# Patient Record
Sex: Female | Born: 1980
Health system: Southern US, Community
[De-identification: ages and names within clinical notes are randomized; demographics above are authoritative.]

## PROBLEM LIST (undated history)

## (undated) DIAGNOSIS — N23 Unspecified renal colic: Secondary | ICD-10-CM

## (undated) DIAGNOSIS — N200 Calculus of kidney: Secondary | ICD-10-CM

## (undated) DIAGNOSIS — E559 Vitamin D deficiency, unspecified: Secondary | ICD-10-CM

## (undated) DIAGNOSIS — G932 Benign intracranial hypertension: Secondary | ICD-10-CM

## (undated) DIAGNOSIS — K5090 Crohn's disease, unspecified, without complications: Secondary | ICD-10-CM

## (undated) DIAGNOSIS — F419 Anxiety disorder, unspecified: Secondary | ICD-10-CM

## (undated) DIAGNOSIS — G43909 Migraine, unspecified, not intractable, without status migrainosus: Secondary | ICD-10-CM

## (undated) DIAGNOSIS — D729 Disorder of white blood cells, unspecified: Secondary | ICD-10-CM

## (undated) DIAGNOSIS — D72828 Other elevated white blood cell count: Secondary | ICD-10-CM

## (undated) DIAGNOSIS — K589 Irritable bowel syndrome without diarrhea: Secondary | ICD-10-CM

## (undated) HISTORY — PX: TONSILLECTOMY: SUR1361

## (undated) HISTORY — DX: Irritable bowel syndrome, unspecified: K58.9

## (undated) HISTORY — DX: Unspecified renal colic: N23

## (undated) HISTORY — PX: LEEP: SHX91

## (undated) HISTORY — DX: Other elevated white blood cell count: D72.828

## (undated) HISTORY — PX: WISDOM TOOTH EXTRACTION: SHX21

## (undated) HISTORY — DX: Migraine, unspecified, not intractable, without status migrainosus: G43.909

## (undated) HISTORY — DX: Disorder of white blood cells, unspecified: D72.9

## (undated) HISTORY — DX: Calculus of kidney: N20.0

## (undated) HISTORY — PX: FOOT SURGERY: SHX648

## (undated) HISTORY — DX: Anxiety disorder, unspecified: F41.9

## (undated) HISTORY — DX: Vitamin D deficiency, unspecified: E55.9

## (undated) HISTORY — DX: Benign intracranial hypertension: G93.2

## (undated) HISTORY — DX: Crohn's disease, unspecified, without complications: K50.90

---
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
## 2003-03-08 ENCOUNTER — Ambulatory Visit (HOSPITAL_COMMUNITY): Admission: RE | Admit: 2003-03-08 | Discharge: 2003-03-08 | Payer: Self-pay | Admitting: Internal Medicine

## 2003-03-15 ENCOUNTER — Other Ambulatory Visit: Admission: RE | Admit: 2003-03-15 | Discharge: 2003-03-15 | Payer: Self-pay | Admitting: Obstetrics and Gynecology

## 2003-05-12 ENCOUNTER — Encounter (INDEPENDENT_AMBULATORY_CARE_PROVIDER_SITE_OTHER): Payer: Self-pay | Admitting: Specialist

## 2003-05-12 ENCOUNTER — Ambulatory Visit (HOSPITAL_COMMUNITY): Admission: RE | Admit: 2003-05-12 | Discharge: 2003-05-12 | Payer: Self-pay | Admitting: Obstetrics and Gynecology

## 2003-09-12 ENCOUNTER — Other Ambulatory Visit: Admission: RE | Admit: 2003-09-12 | Discharge: 2003-09-12 | Payer: Self-pay | Admitting: Obstetrics and Gynecology

## 2004-02-26 ENCOUNTER — Other Ambulatory Visit: Admission: RE | Admit: 2004-02-26 | Discharge: 2004-02-26 | Payer: Self-pay | Admitting: Obstetrics and Gynecology

## 2004-03-04 ENCOUNTER — Ambulatory Visit: Payer: Self-pay | Admitting: Internal Medicine

## 2004-07-08 ENCOUNTER — Ambulatory Visit: Payer: Self-pay | Admitting: Internal Medicine

## 2004-07-09 ENCOUNTER — Ambulatory Visit: Payer: Self-pay | Admitting: Internal Medicine

## 2004-07-30 ENCOUNTER — Ambulatory Visit: Payer: Self-pay | Admitting: Internal Medicine

## 2004-08-02 ENCOUNTER — Ambulatory Visit: Payer: Self-pay | Admitting: Internal Medicine

## 2004-08-07 ENCOUNTER — Encounter (INDEPENDENT_AMBULATORY_CARE_PROVIDER_SITE_OTHER): Payer: Self-pay | Admitting: *Deleted

## 2004-08-07 ENCOUNTER — Ambulatory Visit: Payer: Self-pay | Admitting: Internal Medicine

## 2004-08-30 ENCOUNTER — Other Ambulatory Visit: Admission: RE | Admit: 2004-08-30 | Discharge: 2004-08-30 | Payer: Self-pay | Admitting: Obstetrics and Gynecology

## 2004-09-02 ENCOUNTER — Other Ambulatory Visit: Admission: RE | Admit: 2004-09-02 | Discharge: 2004-09-02 | Payer: Self-pay | Admitting: Obstetrics and Gynecology

## 2005-01-17 IMAGING — CT CT ABDOMEN W/ CM
1 of 4 series · 14 of 32 positions shown, 19 images · IV contrast (omnipaque)
Comparison: none

CLINICAL DATA: Crohn's disease, abdominal pain for two months.  
CT ABDOMEN WITH CONTRAST 
Spiral scanning is performed after oral administration of dilute contrast and during intravenous administration of 150 cc of Omnipaque 300.  
The lung bases are clear.  The liver, spleen, pancreas, adrenal glands, and kidneys all appear normal.  No calcified gallstones.  No free fluid or air.  No bowel pathology is seen in the abdominal portion of the scan.
IMPRESSION
Normal CT scan of the abdomen.
CT PELVIS WITH CONTRAST 
Spiral scanning is performed after oral and intravenous contrast administration.  
There is no free fluid in the pelvis.  The terminal ileum appears normal without gross wall thickening.  The uterus and the left adnexal region appear normal.  Associated with the right ovary, there is an indistinct area of low density measuring 3 cm in diameter.  This could represent an ovarian cyst, an ovarian abscess, or hydrosalpinx.  No bowel pathology is seen in the pelvis. 
1.  No evidence of bowel pathology.  No CT evidence of Crohn's disease. 
2.   3 cm low density region associated with the right ovary which could be an ovarian cyst, an ovarian abscess, or hydrosalpinx.  Clinically it is reported that the patient has right side pain.

[Series 2: abd/pelvis 5.0 b30f · axial · 0.58mm/px · z∈[+518,+888]mm · 14 of 84 slices shown, 19 images]
[im 5/84  soft-tissue]
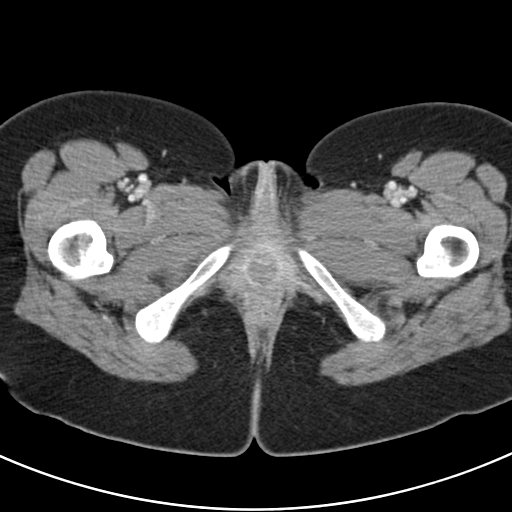
[im 5/84  bone]
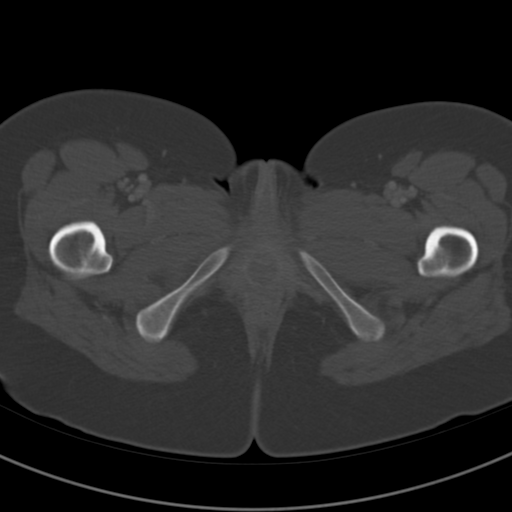
[im 10/84  soft-tissue]
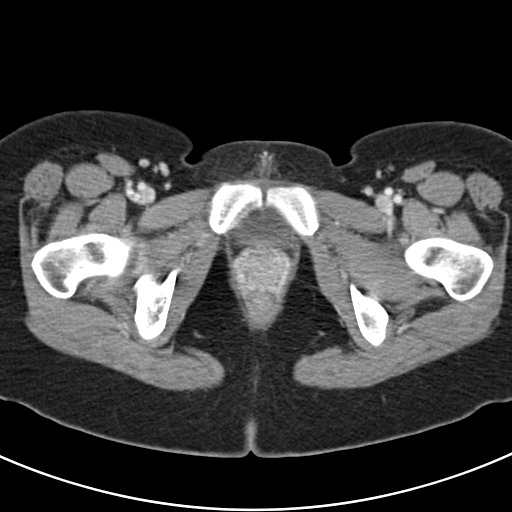
[im 20/84  soft-tissue]
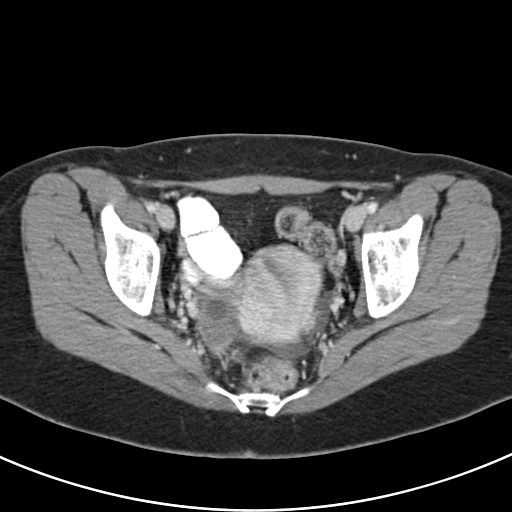
[im 25/84  soft-tissue]
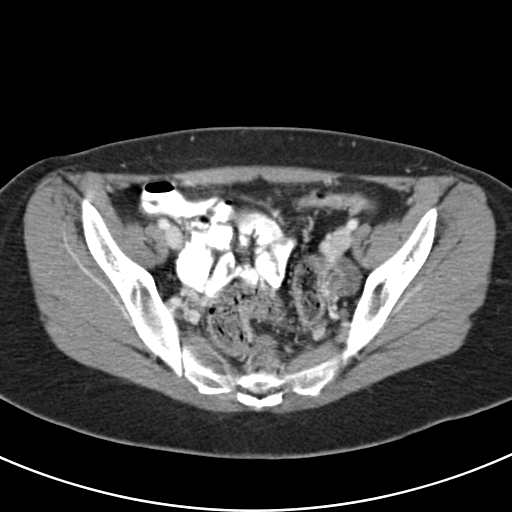
[im 30/84  soft-tissue]
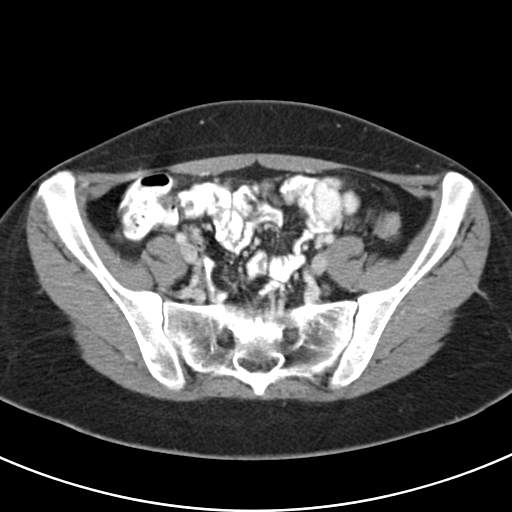
[im 35/84  soft-tissue]
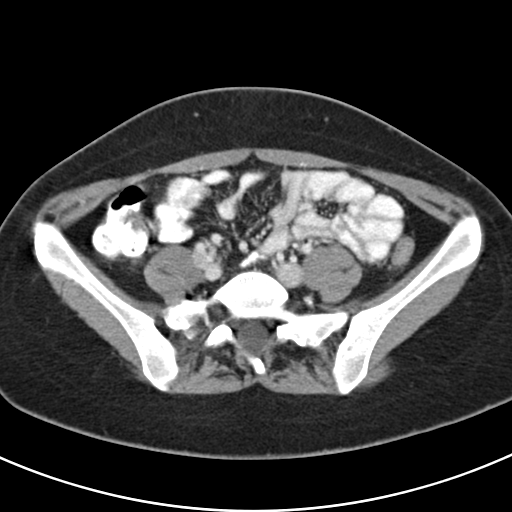
[im 44/84  soft-tissue]
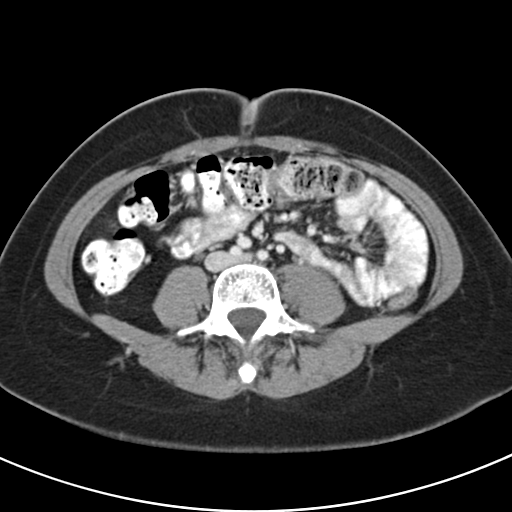
[im 49/84  soft-tissue]
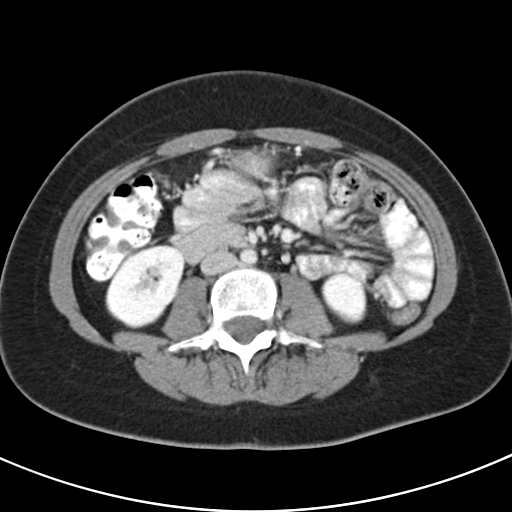
[im 54/84  soft-tissue]
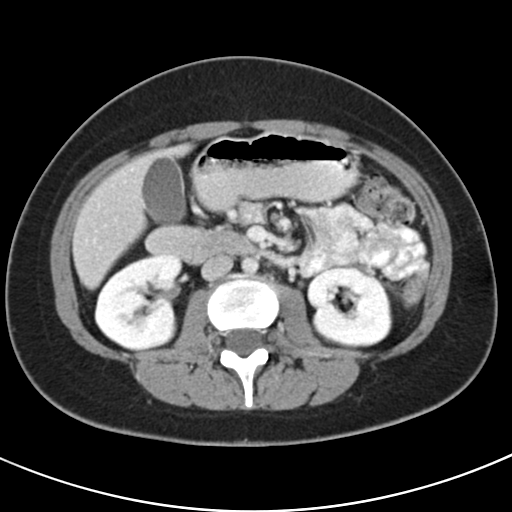
[im 54/84  bone]
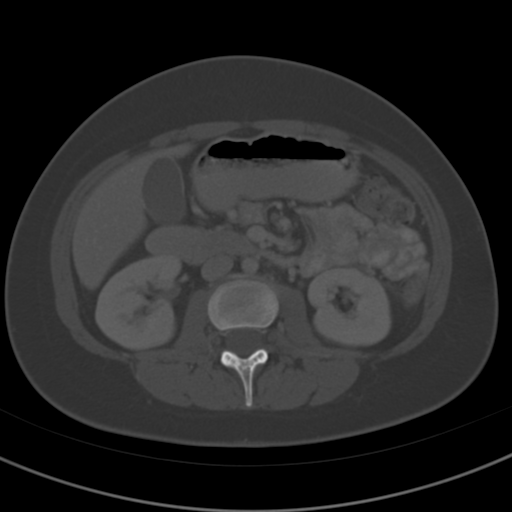
[im 59/84  soft-tissue]
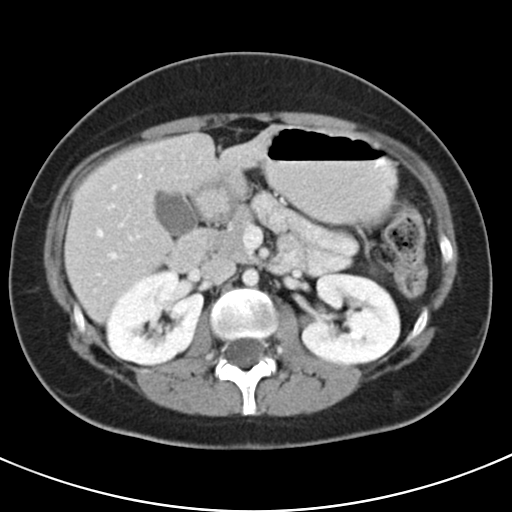
[im 64/84  soft-tissue]
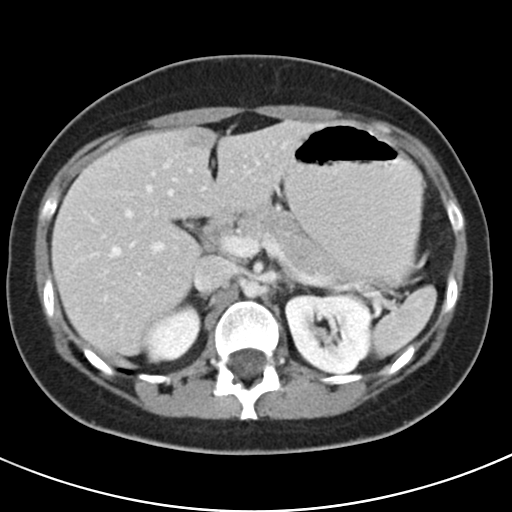
[im 64/84  lung]
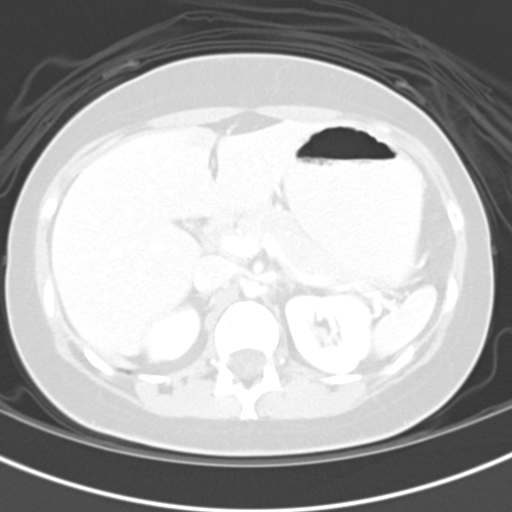
[im 69/84  lung]
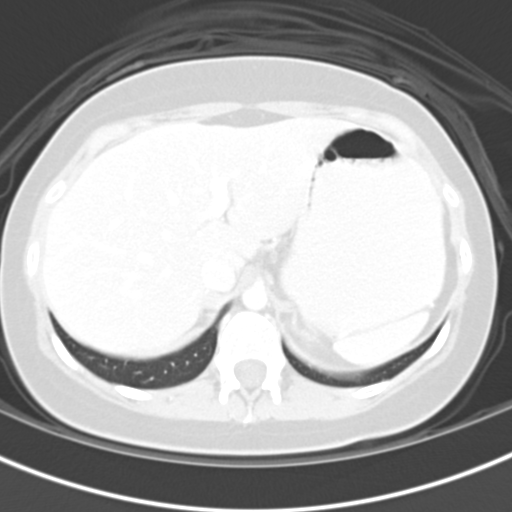
[im 74/84  soft-tissue]
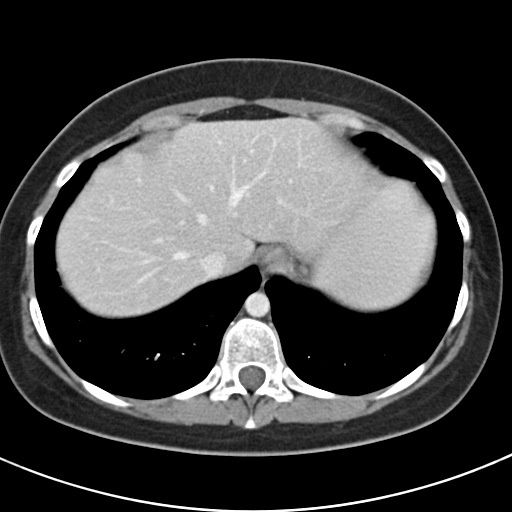
[im 74/84  lung]
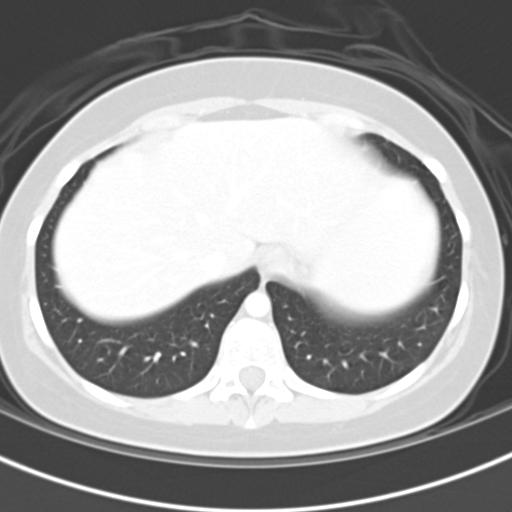
[im 79/84  soft-tissue]
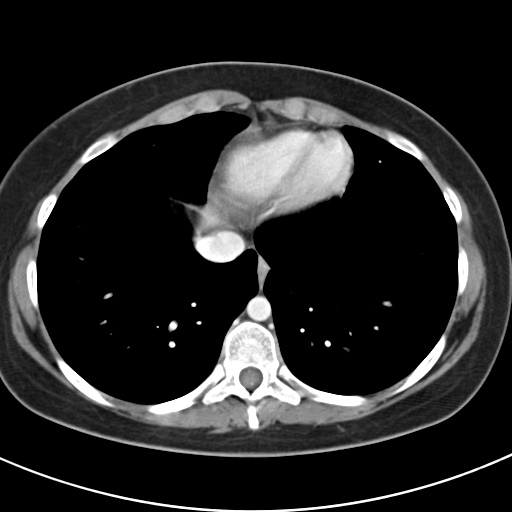
[im 79/84  lung]
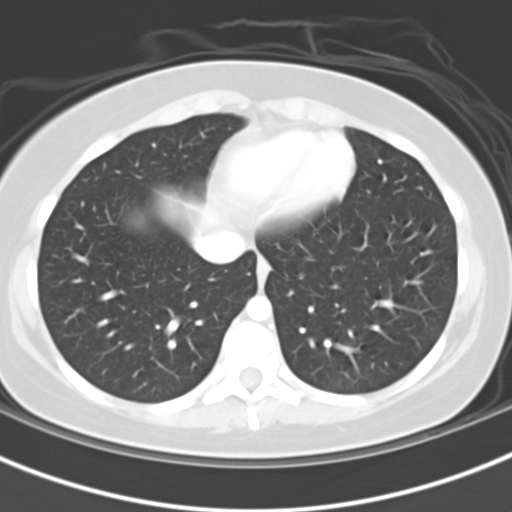

[14 of 32 positions shown; findings below may reference images not displayed]

## 2005-05-08 ENCOUNTER — Other Ambulatory Visit: Admission: RE | Admit: 2005-05-08 | Discharge: 2005-05-08 | Payer: Self-pay | Admitting: Obstetrics and Gynecology

## 2006-05-18 ENCOUNTER — Ambulatory Visit: Payer: Self-pay | Admitting: Internal Medicine

## 2006-05-18 LAB — CONVERTED CEMR LAB
ALT: 18 units/L (ref 0–40)
AST: 18 units/L (ref 0–37)
Albumin: 3.8 g/dL (ref 3.5–5.2)
Alkaline Phosphatase: 72 units/L (ref 39–117)
BUN: 8 mg/dL (ref 6–23)
Basophils Absolute: 0.1 10*3/uL (ref 0.0–0.1)
Basophils Relative: 0.6 % (ref 0.0–1.0)
Bilirubin, Direct: 0.1 mg/dL (ref 0.0–0.3)
CO2: 30 meq/L (ref 19–32)
Calcium: 9.4 mg/dL (ref 8.4–10.5)
Chloride: 103 meq/L (ref 96–112)
Creatinine, Ser: 0.4 mg/dL (ref 0.4–1.2)
Eosinophils Absolute: 0.2 10*3/uL (ref 0.0–0.6)
Eosinophils Relative: 1.6 % (ref 0.0–5.0)
GFR calc Af Amer: 252 mL/min
GFR calc non Af Amer: 208 mL/min
Glucose, Bld: 114 mg/dL — ABNORMAL HIGH (ref 70–99)
HCT: 39.5 % (ref 36.0–46.0)
Hemoglobin: 13.8 g/dL (ref 12.0–15.0)
Lymphocytes Relative: 30.6 % (ref 12.0–46.0)
MCHC: 34.7 g/dL (ref 30.0–36.0)
MCV: 86.9 fL (ref 78.0–100.0)
Monocytes Absolute: 0.8 10*3/uL — ABNORMAL HIGH (ref 0.2–0.7)
Monocytes Relative: 6.7 % (ref 3.0–11.0)
Neutro Abs: 7.5 10*3/uL (ref 1.4–7.7)
Neutrophils Relative %: 60.5 % (ref 43.0–77.0)
Platelets: 893 10*3/uL — ABNORMAL HIGH (ref 150–400)
Potassium: 3.8 meq/L (ref 3.5–5.1)
Prolactin: 7.5 ng/mL
RBC: 4.53 M/uL (ref 3.87–5.11)
RDW: 12.4 % (ref 11.5–14.6)
Sed Rate: 28 mm/hr — ABNORMAL HIGH (ref 0–25)
Sodium: 141 meq/L (ref 135–145)
TSH: 0.41 microintl units/mL (ref 0.35–5.50)
Total Bilirubin: 0.4 mg/dL (ref 0.3–1.2)
Total Protein: 7 g/dL (ref 6.0–8.3)
WBC: 12.4 10*3/uL — ABNORMAL HIGH (ref 4.5–10.5)

## 2006-06-24 ENCOUNTER — Ambulatory Visit: Payer: Self-pay | Admitting: Internal Medicine

## 2008-03-24 ENCOUNTER — Telehealth: Payer: Self-pay | Admitting: Internal Medicine

## 2008-03-26 ENCOUNTER — Telehealth: Payer: Self-pay | Admitting: Internal Medicine

## 2009-02-12 ENCOUNTER — Telehealth: Payer: Self-pay | Admitting: Internal Medicine

## 2009-07-05 ENCOUNTER — Telehealth: Payer: Self-pay | Admitting: Internal Medicine

## 2009-07-09 ENCOUNTER — Ambulatory Visit: Payer: Self-pay | Admitting: Internal Medicine

## 2009-07-09 DIAGNOSIS — K5090 Crohn's disease, unspecified, without complications: Secondary | ICD-10-CM | POA: Insufficient documentation

## 2009-07-09 DIAGNOSIS — Z87442 Personal history of urinary calculi: Secondary | ICD-10-CM | POA: Insufficient documentation

## 2009-07-09 DIAGNOSIS — N23 Unspecified renal colic: Secondary | ICD-10-CM | POA: Insufficient documentation

## 2009-07-09 DIAGNOSIS — G932 Benign intracranial hypertension: Secondary | ICD-10-CM | POA: Insufficient documentation

## 2009-07-09 DIAGNOSIS — F411 Generalized anxiety disorder: Secondary | ICD-10-CM | POA: Insufficient documentation

## 2009-07-09 DIAGNOSIS — K589 Irritable bowel syndrome without diarrhea: Secondary | ICD-10-CM | POA: Insufficient documentation

## 2009-07-09 DIAGNOSIS — Z87898 Personal history of other specified conditions: Secondary | ICD-10-CM | POA: Insufficient documentation

## 2009-07-10 LAB — CONVERTED CEMR LAB
ALT: 16 units/L (ref 0–35)
AST: 20 units/L (ref 0–37)
Albumin: 4.8 g/dL (ref 3.5–5.2)
Alkaline Phosphatase: 114 units/L (ref 39–117)
BUN: 10 mg/dL (ref 6–23)
Basophils Absolute: 0.1 10*3/uL (ref 0.0–0.1)
Basophils Relative: 0.5 % (ref 0.0–3.0)
CO2: 26 meq/L (ref 19–32)
Calcium: 9.5 mg/dL (ref 8.4–10.5)
Chloride: 101 meq/L (ref 96–112)
Creatinine, Ser: 0.6 mg/dL (ref 0.4–1.2)
Eosinophils Absolute: 0.1 10*3/uL (ref 0.0–0.7)
Eosinophils Relative: 0.7 % (ref 0.0–5.0)
GFR calc non Af Amer: 126.44 mL/min (ref >60)
Glucose, Bld: 72 mg/dL (ref 70–99)
HCT: 39.5 % (ref 36.0–46.0)
Hemoglobin: 13.6 g/dL (ref 12.0–15.0)
Lymphocytes Relative: 36 % (ref 12.0–46.0)
Lymphs Abs: 4.1 10*3/uL — ABNORMAL HIGH (ref 0.7–4.0)
MCHC: 34.4 g/dL (ref 30.0–36.0)
MCV: 87.1 fL (ref 78.0–100.0)
Monocytes Absolute: 0.8 10*3/uL (ref 0.1–1.0)
Monocytes Relative: 6.8 % (ref 3.0–12.0)
Neutro Abs: 6.3 10*3/uL (ref 1.4–7.7)
Neutrophils Relative %: 56 % (ref 43.0–77.0)
Platelets: 647 10*3/uL — ABNORMAL HIGH (ref 150.0–400.0)
Potassium: 3.8 meq/L (ref 3.5–5.1)
RBC: 4.54 M/uL (ref 3.87–5.11)
RDW: 14.8 % — ABNORMAL HIGH (ref 11.5–14.6)
Sed Rate: 17 mm/hr (ref 0–22)
Sodium: 141 meq/L (ref 135–145)
Total Bilirubin: 0.9 mg/dL (ref 0.3–1.2)
Total Protein: 7.7 g/dL (ref 6.0–8.3)
WBC: 11.3 10*3/uL — ABNORMAL HIGH (ref 4.5–10.5)

## 2009-07-23 ENCOUNTER — Telehealth: Payer: Self-pay | Admitting: Internal Medicine

## 2009-09-28 ENCOUNTER — Telehealth: Payer: Self-pay | Admitting: Internal Medicine

## 2009-10-01 ENCOUNTER — Telehealth: Payer: Self-pay | Admitting: Internal Medicine

## 2009-10-16 ENCOUNTER — Telehealth: Payer: Self-pay | Admitting: Internal Medicine

## 2010-02-28 ENCOUNTER — Encounter: Payer: Self-pay | Admitting: Internal Medicine

## 2010-04-16 NOTE — Progress Notes (Signed)
Summary: Medication   Phone Note Call from Patient Call back at Home Phone [phone]   Caller: Patient Call For: Dr. Olevia Perches Reason for Call: Talk to Nurse Summary of Call: Jessica Cordova is too expensive...Marland Kitchenneeds an alternative med Initial call taken by: Webb Laws,  October 01, 2009 12:06 PM  Follow-up for Phone Call        Patient states that she has done much better symptomatically since taking Lialda. She states she does have very occasional abdominal discomfort and diarrhea but no rectal bleeding. She says however, that it is too expensive to afford. Shall we try her on Asacol???? Also, she would like an antispasmotic that she can take immediately at the time of her "cramping attacks." She is taking Bentyl which does help but would like something more immediate if possible.  Follow-up by: Madlyn Frankel CMA Deborra Medina),  October 01, 2009 12:47 PM  Additional Follow-up for Phone Call Additional follow up Details #1::        Azulfidine 541m, #120, 1 by mouth two times a day x 1 week then 2 by mouth two times a day, 3 refills. Folic acid 1 mg, ##6601 by mouth once daily,  Bentyl is the cheapest, she could try Levsin SL .125 mg, #60, 1sl q 4 hrs as needed cramps Additional Follow-up by: DLafayette DragonMD,  October 01, 2009 1:55 PM    Additional Follow-up for Phone Call Additional follow up Details #2::    I have spoken to patient and she is agreeable to the above plan. Medications sent electronically to patient's pharmacy. Follow-up by: DMadlyn FrankelCMA ([place],  October 01, 2009 2:09 PM  New/Updated Medications: AZULFIDINE 500 MG TABS (SULFASALAZINE) Take 1 tablet by mouth two times a day x 1 week, then increase to 2 tablets by mouth two times a day. PLEASE D/C RX FOR LIALDA! FOLIC ACID 1 MG TABS (FOLIC ACID) Take 1 tablet by mouth once daily LEVSIN/SL 0.125 MG SUBL (HYOSCYAMINE SULFATE) Dissolve 1 tablet under tongue every 4 hours as needed for cramps Prescriptions: LEVSIN/SL 0.125 MG  SUBL (HYOSCYAMINE SULFATE) Dissolve 1 tablet under tongue every 4 hours as needed for cramps  #60 x 0   Entered by:   DMadlyn FrankelCMA (AAMA)   Authorized by:   DLafayette DragonMD   Signed by:   DMadlyn FrankelCMA (AAMA) on 10/01/2009   Method used:   [facility]       [address]      [place]  [address]      Ph: [phone]      Fax: [phone]  RxID:   [id number] ACID 1 MG TABS (FOLIC ACID) Take 1 tablet by mouth once daily  #100 x 0   Entered by:   DMadlyn FrankelCMA (AFithian   Authorized by:   DLafayette DragonMD   Signed by:   [place] on 10/01/2009   Method used:   [facility]       [address]      [place]  [address]      Ph: [phone]      Fax: [phone]  RxID:   [id number]  500 MG TABS (SULFASALAZINE) Take 1 tablet by mouth two times a day x 1 week, then increase to 2 tablets by mouth two times a day. PLEASE D/C RX FOR LIALDA!  #120 x 3   Entered by:   Madlyn Frankel CMA (AAMA)   Authorized by:   Lafayette Dragon MD   Signed by:   [place] ([place]) on 10/01/2009   Method used:   Electronically to        [facility] (retail)       [address]       [place], [place]  [address]       Ph: [phone]       Fax: [phone]   RxID:   [id number]

## 2010-04-16 NOTE — Procedures (Signed)
Summary: COLON   Colonoscopy  Procedure date:  07/08/2004  Findings:      Location:  [facility].   Patient Name: Jessica Cordova, Jessica Cordova MRN: [id number] Procedure Procedures: Colonoscopy CPT: [phone].    with multiple biopsies. CPT: [phone]. There were greater than 10 biopsies taken.  Personnel: Endoscopist: Sony Schlarb L. Olevia Perches, MD.  Exam Location: Exam performed in Outpatient Clinic. Outpatient  Patient Consent: Procedure, Alternatives, Risks and Benefits discussed, consent obtained, from patient. Consent was obtained by the RN.  Indications  Evaluation of: Established Crohn's Disease.  Surveillance of: 2002.  History  Current Medications: Patient is not currently taking Coumadin.  Pre-Exam Physical: Performed Aug 07, 2004. Entire physical exam was normal.  Exam Exam: Extent of exam reached: Cecum, extent intended: Cecum.  The cecum was identified by appendiceal orifice and IC valve. Colon retroflexion performed. Images taken. ASA Classification: I. Tolerance: good.  Monitoring: Pulse and BP monitoring, Oximetry used. Supplemental O2 given.  Colon Prep Used Miralax for colon prep. Prep results: good.  Sedation Meds: Patient assessed and found to be appropriate for moderate (conscious) sedation. Fentanyl 150 mcg. given IV. Versed 15 given IV.  Comments: large amount of sedation, pt had a an emotional  release during the procedure, suggest Propaphol sedation next time Findings - DIAGNOSTIC TEST: Biopsy taken. from Descending Colon to Sigmoid Colon. Comments: normal appearing mucosa 30-40 cm, Bx's # 2.  - DIAGNOSTIC TEST: taken. from Cecum to Ascending Colon. Comments: normal appearing mucosa, bx#1.  DIAGNOSTIC TEST: taken. from Sigmoid Colon. Comments: minim al erythema 0-20cm bx'x # 3.  - MUCOSAL ABNORMALITY: Sigmoid Colon to Rectum. Erythema present. Biopsy/Mucosal Abn. taken. ICD9: Crohn's Disease: 555.9. Comments: mild erythema 0-20 cm, nonspecific,  no aphthous ulcers.   Assessment Normal examination.  Diagnoses: 555.9: Crohn's Disease.   Comments: no evidence of active Crohn's disease, only nonspecific changes in the rectosigmoid colon Events  Unplanned Interventions: No intervention was required.  Unplanned Events: There were no complications. Plans Medication Plan: Await pathology. Continue current medications.  Disposition: After procedure patient sent to recovery. After recovery patient sent home.   This report was created from the original endoscopy report, which was reviewed and signed by the above listed endoscopist.     FINAL DIAGNOSIS    ***MICROSCOPIC EXAMINATION AND DIAGNOSIS***    1. ENDOSCOPIC BIOPSY, RANDOM COLON: BENIGN COLONIC MUCOSA WITH   NONSPECIFIC CHANGES, NO EVIDENCE OF ACTIVE COLITIS, DYSPLASIA OR   MALIGNANCY.    2. ENDOSCOPIC BIOPSY, COLON 20-30 CM: BENIGN COLONIC MUCOSA   WITH FOCAL LYMPHOID AGGREGATES, NEGATIVE FOR FEATURES OF ACTIVE   COLITIS, DYSPLASIA OR MALIGNANCY.    3. ENDOSCOPIC BIOPSY, RECTOSIGMOID COLON 0-20 CM: QUIESCENT   COLITIS, SEE COMMENT.    4. ENDOSCOPIC BIOPSY, RECTUM: QUIESCENT PROCTITIS, SEE COMMENT.    COMMENT   3. There is architectural distortion without significant   activity. There is no evidence of dysplasia or malignancy.    4. There is evidence of glandular dropout and architectural   distortion. There is no evidence of active proctitis, dysplasia   or malignancy. (ALL:caf 08/08/04)    cf   Date Reported: 08/08/2004 Arlene L. Golden Circle, MD   *** Electronically Signed Out By ALL ***    Clinical information   Normal appearing, LT colon erythema, H/O Crohn' s disease (tw)    specimen(s) obtained   1: Colon, biopsy, random LT   2: Colon, biopsy, 20-30 cm   3: Colon, biopsy, rectosigmoid 0-20 cm   4: Rectum, biopsy  Gross Description   1. Received in formalin are tan, soft tissue fragments that are   submitted in toto. Number: multiple   Size: 0.4  to 0.7 cm, 1 block submitted.    2. Received in formalin are tan, soft tissue fragments that are   submitted in toto. Number: multiple   Size: 0.2 to 0.3 cm, 1 block submitted.    3. Received in formalin are tan, soft tissue fragments that are   submitted in toto. Number: multiple   Size: 0.2 to 0.3 cm, 1 block submitted.    4. Received in formalin are tan, soft tissue fragments that are   submitted in toto. Number: multiple   Size: 0.2 to 0.3 cm, 1 block submitted. (ml:tw 08/07/04)    tw/

## 2010-04-16 NOTE — Progress Notes (Signed)
Summary: Triage   Phone Note Call from Patient Call back at [phone]   Caller: Beverely Low Call For: brodie Reason for Call: Talk to Nurse Summary of Call: Mother wants patient seen before first availabe do to flare up Crohn's. Initial call taken by: Ronalee Red,  July 05, 2009 1:43 PM  Follow-up for Phone Call        Spoke with pt's mother, Rodena Piety.  Pt has been having trouble for 3 months now.  Pt has no insurance, mother has talked with [facility] re a payment plan for medical care.  Mother feels that pt needs to be seen asap.  Offered appt with Nicoletta Ba PA first of next week.  Mother thinks pt would be more comfortable seeing Dr. Olevia Perches.  Informed mother that this message will be forwarded to Vivia Ewing LPN, who will check Dr. Nichola Sizer schedule and call back next week  to discuss appt times.   Mother states this will be fine but also asks if we can give Dr. Olevia Perches this message and if Dr. Olevia Perches gets a chance if she call call Rodena Piety.   Follow-up by: Alberteen Spindle RN,  July 05, 2009 2:33 PM  Additional Follow-up for Phone Call Additional follow up Details #1::        I have spoken to Rodena Piety, Ayden's mother, please add pt on for next week, she will negotiate the payments., thanx Additional Follow-up by: Lafayette Dragon MD,  July 05, 2009 5:41 PM    Additional Follow-up for Phone Call Additional follow up Details #2::    Pt. can see Dr.Brodie today at 1:45pm. Pt's mother agrees to appt. & the $180.00 appt. fee, she is aware we can give her paperwork to help pt. get financial assistance set-up.  Follow-up by: Vivia Ewing LPN,  July 09, 4140 8:40 AM

## 2010-04-16 NOTE — Progress Notes (Signed)
Summary: CONDITION UPDATE   Phone Note Call from Patient Call back at Home Phone [phone]   Summary of Call: Patient wants to speak to nurse regarding condt update. Initial call taken by: Ronalee Red,  Jul 23, 2009 2:28 PM  Follow-up for Phone Call        Last OV 07-09-09.  Pt. states she is feeling better, but continues with episodes of cramping and urgency, "It's a lot better than it was before."  Sees blood in the toilet with every BM, "But I think I just tore something."  Takes Lialda 1 two times a day and Bentyl 58m two times a day. Follow-up by: DVivia EwingLPN,  May  9, 2902121:15PM  Additional Follow-up for Phone Call Additional follow up Details #1::        wait 2 weeks then call again, it takes a while for Lialda to work Additional Follow-up by: DLafayette DragonMD,  Jul 23, 2009 8:28 PM    Additional Follow-up for Phone Call Additional follow up Details #2::    Message left for pt. with above MD instructions.  Follow-up by: DVivia EwingLPN,  May 10, 2520880:22AM

## 2010-04-16 NOTE — Assessment & Plan Note (Signed)
Summary: CROHN'S FLARE              [place]    History of Present Illness Visit Type: Follow-up Visit Primary GI MD: [name] MD Primary Provider: Carol Ada, MD  Requesting Provider: n/a Chief Complaint: Crohn's flare. Pt c/o lower abd pain and diarrhea  History of Present Illness:   This is a 30 year old white female with Crohn's disease since age 80. I have not seen her now for 3 years. Her last appointment was in April 2008. She has not been on any medications. Her last medication for the Crohn's was sulfasalazine. She discontinued her 6-mercaptopurine. She is now breast-feeding her 29 month old baby. She felt very well during pregnancy but has developed severe diarrhea and crampy abdominal pain since the delivery. Her weight has decreased and she is now close to her prepregnancy weight. Other problems include anxiety, irritable bowel syndrome, renal colic, kidney stones and migraine headaches. Her last colonoscopy in in May 2006 was normal. She was treated with Remicade in 1999 and in 2002.   GI Review of Systems    Reports abdominal pain.     Location of  Abdominal pain: lower abdomen.    Denies acid reflux, belching, bloating, chest pain, dysphagia with liquids, dysphagia with solids, heartburn, loss of appetite, nausea, vomiting, vomiting blood, weight loss, and  weight gain.      Reports diarrhea.     Denies anal fissure, black tarry stools, change in bowel habit, constipation, diverticulosis, fecal incontinence, heme positive stool, hemorrhoids, irritable bowel syndrome, jaundice, light color stool, liver problems, rectal bleeding, and  rectal pain.    Current Medications (verified): 1)  None  Allergies (verified): No Known Drug Allergies  Past History:  Past Medical History: Reviewed history from 07/09/2009 and no changes required. Current Problems:  MIGRAINES, HX OF (EFE-O71.2) Hx of RENAL COLIC (RFX-588.3) NEPHROLITHIASIS, HX OF (ICD-V13.01) Hx of PSEUDOTUMOR  CEREBRI (ICD-348.2) ANXIETY (ICD-300.00) IRRITABLE BOWEL SYNDROME (ICD-564.1) CROHN'S DISEASE (ICD-555.9)    Past Surgical History: Reviewed history from 07/09/2009 and no changes required. Tonsillectomy Left foot surgery x 2 Leep procedure Wisdom teeth extraction  Family History: Family History of Colon Cancer:Paternal Aunt  Family History of Diabetes: MGGF Family History of Heart Disease: MGF  Social History: Unemployed Former Smoker  Alcohol Use - no Illicit Drug Use - no  Review of Systems       The patient complains of back pain and fatigue.  The patient denies allergy/sinus, anemia, anxiety-new, arthritis/joint pain, blood in urine, breast changes/lumps, change in vision, confusion, cough, coughing up blood, depression-new, fainting, fever, headaches-new, hearing problems, heart murmur, heart rhythm changes, itching, menstrual pain, muscle pains/cramps, night sweats, nosebleeds, pregnancy symptoms, shortness of breath, skin rash, sleeping problems, sore throat, swelling of feet/legs, swollen lymph glands, thirst - excessive , urination - excessive , urination changes/pain, urine leakage, vision changes, and voice change.         Pertinent positive and negative review of systems were noted in the above HPI. All other ROS was otherwise negative.   Vital Signs:  Patient profile:   30 year old female Height:      61 inches Weight:      137 pounds BMI:     25.98 BSA:     1.61 Pulse rate:   76 / minute Pulse rhythm:   regular BP sitting:   112 / 72  (left arm) Cuff size:   regular  Vitals Entered By: Hope Pigeon [place] (July 09, 2009  1:53 PM)  Physical Exam  General:  Well developed, well nourished, no acute distress. Eyes:  PERRLA, no icterus. Mouth:  No deformity or lesions, dentition normal. Neck:  Supple; no masses or thyromegaly. Lungs:  Clear throughout to auscultation. Heart:  Regular rate and rhythm; no murmurs, rubs,  or bruits. Abdomen:  soft abdomen with  hyperactive bowel sounds and mild tenderness in left lower quadrant. No distention. Liver edge at costal margin. Rectal:  normal rectal tone. Stool is soft and Hemoccult negative. No prolapse and no fissure. Extremities:  no edema. Patient is complaining of pain in her joints. Skin:  papular rash over the arms which is pruritic and quite nonspecific. Psych:  Alert and cooperative. Normal mood and affect.   Impression & Recommendations:  Problem # 1:  CROHN'S DISEASE (ICD-555.9) Patient has Crohn's disease of the colon with recurrence of diarrhea. We will start her on Lialda 1.2 g twice a day. She has samples. 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 She will also take Bentyl 20 mg twice a day. We will obtain a CBC, sedimentation rate and metabolic panel. Orders: TLB-CBC Platelet - w/Differential (85025-CBCD) TLB-CMP (Comprehensive Metabolic Pnl) (56979-YIAX) TLB-Sedimentation Rate (ESR) (85652-ESR)  Problem # 2:  IRRITABLE BOWEL SYNDROME (ICD-564.1) Patient is to start Bentyl 20 mg twice a day. She may eventually need anti-anxiety medication but should not right now since she is nursing. In the meantime, she will get Phenergan 25 mg to take p.r.n. and will then pump and discard milk to avoid effects to the baby.  Patient Instructions: 1)  Lialda 1.2 g twice a day. 2)  Bentyl 10 mg p.o. b.i.d. 3)  CBC, sedimentation rate and metabolic panel. 4)  Phenergan 25 mg 1/2-1 tablet every 6 hours p.r.n. nausea. 5)  Call us in 2 weeks to give [place] an update. 6)  Copy sent to : Dr C.Smith 7)  The medication list was reviewed and reconciled.  All changed / newly prescribed medications were explained.  A complete medication list was provided to the patient / caregiver. Prescriptions: PROMETHAZINE HCL 25 MG TABS (PROMETHAZINE HCL) Take 1/2-1 tablet by mouth every 6-8 hours as needed. Please pump and discard breast milk after taking  this.  #30 x 0   Entered by:   Madlyn Frankel CMA (AAMA)   Authorized by:   Lafayette Dragon MD   Signed by:   [place] (AAMA) on 07/09/2009   Method used:   Electronically to        [facility] (retail)       [address]       [place], [place]  [address]       Ph: [phone]       Fax: [phone]   RxID:   [id number] BENTYL 20 MG TABS (DICYCLOMINE HCL) Take 1 tablet by mouth two times a day  #60 x 1   Entered by:   Madlyn Frankel CMA ([place])   Authorized by:   Lafayette Dragon MD   Signed by:   [place] ([place]) on 07/09/2009   Method used:   Electronically to        [facility].* (retail)       [address]  [place], [place]  [address]       Ph: [phone]       Fax: [phone]   RxID:   [id number]

## 2010-04-16 NOTE — Progress Notes (Signed)
Summary: Triage   Phone Note Call from Patient Call back at Home Phone [phone]   Caller: Patient Call For: Dr. Olevia Perches Reason for Call: Talk to Nurse Summary of Call: Wants to know if she can take Sulfazine while she is nursing Initial call taken by: Webb Laws,  October 16, 2009 3:19 PM  Follow-up for Phone Call        Dr Olevia Perches please advise. Follow-up by: Barb Merino RN, CGRN,  October 16, 2009 3:29 PM  Additional Follow-up for Phone Call Additional follow up Details #1::        OK to be nursing while vtaking Sulfasalazine Additional Follow-up by: Lafayette Dragon MD,  October 16, 2009 11:19 PM    Additional Follow-up for Phone Call Additional follow up Details #2::    I have left a message for the patient ok to nurse on sulfsalazine Follow-up by: Barb Merino RN, CGRN,  October 17, 2009 8:28 AM

## 2010-04-16 NOTE — Progress Notes (Signed)
Summary: Medication refill  Medications Added LIALDA 1.2 GM TBEC (MESALAMINE) Take 1 tablet by mouth two times a day. MUST CALL OFFICE WITH CONDITION UPDATE FOR FURTHER REFILLS! BENTYL 20 MG TABS (DICYCLOMINE HCL) Take 1 tablet by mouth two times a day. MUST CALL OFFICE WITH CONDITION UPDATE FOR FURTHER REFILLS!       Phone Note Call from Patient Call back at [facility] Phone [phone]   Caller: Patient Call For: Dr. Olevia Perches Reason for Call: Refill Medication Summary of Call: Refill on her Bentyl and Lialda...Marland KitchenMarland KitchenRandleman [facility] Initial call taken by: Webb Laws,  September 28, 2009 2:12 PM    New/Updated Medications: LIALDA 1.2 GM TBEC (MESALAMINE) Take 1 tablet by mouth two times a day. MUST CALL OFFICE WITH CONDITION UPDATE FOR FURTHER REFILLS! BENTYL 20 MG TABS (DICYCLOMINE HCL) Take 1 tablet by mouth two times a day. MUST CALL OFFICE WITH CONDITION UPDATE FOR FURTHER REFILLS! Prescriptions: BENTYL 20 MG TABS (DICYCLOMINE HCL) Take 1 tablet by mouth two times a day. MUST CALL OFFICE WITH CONDITION UPDATE FOR FURTHER REFILLS!  #60 x 0   Entered by:   Madlyn Frankel CMA (AAMA)   Authorized by:   Lafayette Dragon MD   Signed by:   [place] (AAMA) on 09/28/2009   Method used:   Electronically to        [facility] (retail)       [address]       [place], [place]  [address]       Ph: [phone]       Fax: [phone]   RxID:   [id number] LIALDA 1.2 GM TBEC (MESALAMINE) Take 1 tablet by mouth two times a day. MUST CALL OFFICE WITH CONDITION UPDATE FOR FURTHER REFILLS!  #60 x 0   Entered by:   Madlyn Frankel CMA (AAMA)   Authorized by:   Lafayette Dragon MD   Signed by:   [place] ([place]) on 09/28/2009   Method used:   Electronically to        [facility] (retail)       [address]       [place], [place]  [address]       Ph: [phone]       Fax:  [phone]   RxID:   [id number]

## 2010-04-16 NOTE — Procedures (Signed)
Summary: EGD   EGD  Procedure date:  06/04/1999  Findings:      Location: [place]. [facility]  Patient:    Jessica Cordova, Jessica Cordova                    MRN: [id number] Adm. Date:  [id number] Attending:  Vincent Peyer                           Procedure Report  PROCEDURE:  Upper endoscopy.  INDICATIONS FOR PROCEDURE:  This 30 year old white female with Crohns disease of approximately eight years duration has had maximum medical therapy for Crohns colitis.  She has been given Remicade as well as 6MP ______ .  She is currently in remission, but has had persistent nausea with occasional vomiting.  Gastric emptying scan was normal.  She has been on acid suppressing agents on a  chronic basis.  She has had mild epigastric tenderness at times.  Her stool has  been Hemoccult negative.  She has missed school because of persistent morning nausea.  She is now undergoing upper endoscopy for evaluation of her epigastric  pain as well as nausea.  ENDOSCOPE:  Fujinon single channel videoscope.  SEDATION:  Versed 10 mg IV.  Fentanyl 50 mcg IV.  FINDINGS:  The Fujinon single channel videoscope was passed under direct vision  through the posterior pharynx and esophagus.  The patient was monitored by pulse oximeter.  Oxygen saturations were normal.  The proximal and distal esophageal mucosa was normal.  There was no esophagitis and no abdominal of the Z-line.  STOMACH;  The stomach was insufflated with air and showed a small amount of gastric secretions, no bile reflux and normal appearing gastric mucosa.  Retroflexion of the endoscope revealed normal fundus and cardia.  Video photographs were obtained of the fundus, body and gastric antrum.  DUODENUM:  The duodenal bulb and descending duodenum were unremarkable. Multiple biopsies were taken from the descending duodenum to rule out intestinal atrophy or microscopic inflammatory  bowel disease.  The patient tolerated the procedure well.  IMPRESSION:  Normal upper endoscopy ______ stomach and duodenum status post small bowel biopsies.  PLAN: 1. There is no evidence for organic lesions. 2. Await results from the biopsies. 3. The patient is to continue on Pepcid and all of her medications for Crohns    disease. DD:  06/04/99 TD:  06/04/99 Job: [id number]  This report was created from the original endoscopy report, which was reviewed and signed by the above listed endoscopist.     FINAL DIAGNOSIS    ***MICROSCOPIC EXAMINATION AND DIAGNOSIS***    SMALL BOWEL, BIOPSY of second portion of duodenum: UNREMARKABLE   SMALL BOWEL MUCOSA. NO VILLOUS ATROPHY, INFLAMMATION OR OTHER   ABNORMALITIES PRESENT.    JO ANN SHAW MD    COMMENT   There is small bowel mucosa with normal villous architecture and   no objective increase in inflammation. No villous atrophy,   active inflammation or other significant changes identified.    tw   Date Reported: 06/05/1999 Neldon Mc, MD   *** Electronically Signed Out By JAS ***    Clinical information   NORMAL EXAM, R/O ATROPHY; CROHN' S COLITIS, PERSISTENT NAUSEA.   (SFG/tw)    specimen(s) obtained   2ND PORTION DUODENUM  Gross Description   Received in formalin are tan, soft tissue fragments that are   submitted in toto. Number: 2   Size:2.0 3.0 mm. (CL:jn, 06/04/99)    jn/

## 2010-04-18 NOTE — Miscellaneous (Signed)
Summary: Lialda  Patient's mother picked up stock bottle of lialda (#120 pills) for patient when she was here for her office visit. Per Dr Olevia Perches, patient should be taking Lialda 2 tablets by mouth once daily. Mother advised.  Dottie Nelson-Smith CMA Deborra Medina)  February 28, 2010 1:09 PM

## 2010-08-02 NOTE — Assessment & Plan Note (Signed)
[place] OFFICE NOTE   NAME:Heinz, ALIXANDREA MILLESON                  MRN:          [id number]  DATE:06/24/2006                            DOB:          1980/09/24    Kaileia is a 30 year old white female with Crohn's colitis.  She had  recent exacerbation of her disease, G2, noncompliance with the  medications.  She does not have insurance after finishing school and  discontinued her Asacol and 6-mercaptopurine.  She started having  diarrhea and abdominal pain on her last visit on May 18, 2006.  We have  restarted her on Azulfidine 1 gram twice a day with folic acid and 6-  mercaptopurine 50 mg a day.  She did not start taking her 6-MP because  she received it in the mail yesterday.  The diarrhea has mostly resolved  but she still has some constipation and occasional crampy abdominal  pain, no rectal bleeding.  There has been intermittent rectal irritation  associated with straining, there has been no fever.   PHYSICAL EXAMINATION:  Blood pressure 104/60, pulse 88, and weight 127  pounds.  She was alert and oriented in no distress.  LUNGS:  Clear to auscultation.  COR:  Normal S1, S2.  ABDOMEN:  Was soft, tender mostly int he left lower and left middle  quadrant.  No rebound, no palpable mass,  RECTAL:  Not done.  EXTREMITIES:  No edema.   IMPRESSION:  30 year old white female with Crohn's colitis and about 50%  - 60% improved on sulfasalazine.  She does not have any insurance  currently and therefore she can not afford taking certain medications.  We have switched her to Azulfidine which has done quite well and we will  restart her 6-mercaptopruine 50 mg daily.  She also has the Donnatal  extend tabs to use on p.r.n. basis, she has not used it yet and may not  need since her diarrhea has resolved.  I would like to talk to her in  about 4-6 weeks on the phone and at some point  repeat her sedimentation  rate which was last time elevated up to 28.     Lowella Bandy. Olevia Perches, MD  Electronically Signed    DMB/MedQ  DD: 06/24/2006  DT: 06/24/2006  Job #: [id number]   cc:   Biagio Borg, M.D.

## 2010-08-02 NOTE — Procedures (Signed)
[place]. [facility]  Patient:    Jessica Cordova, Jessica Cordova                    MRN: [id number] Adm. Date:  [id number] Attending:  Vincent Peyer                           Procedure Report  PROCEDURE:  Upper endoscopy.  INDICATIONS FOR PROCEDURE:  This 30 year old white female with Crohns disease of approximately eight years duration has had maximum medical therapy for Crohns colitis.  She has been given Remicade as well as 6MP ______ .  She is currently in remission, but has had persistent nausea with occasional vomiting.  Gastric emptying scan was normal.  She has been on acid suppressing agents on a  chronic basis.  She has had mild epigastric tenderness at times.  Her stool has  been Hemoccult negative.  She has missed school because of persistent morning nausea.  She is now undergoing upper endoscopy for evaluation of her epigastric  pain as well as nausea.  ENDOSCOPE:  Fujinon single channel videoscope.  SEDATION:  Versed 10 mg IV.  Fentanyl 50 mcg IV.  FINDINGS:  The Fujinon single channel videoscope was passed under direct vision  through the posterior pharynx and esophagus.  The patient was monitored by pulse oximeter.  Oxygen saturations were normal.  The proximal and distal esophageal mucosa was normal.  There was no esophagitis and no abdominal of the Z-line.  STOMACH;  The stomach was insufflated with air and showed a small amount of gastric secretions, no bile reflux and normal appearing gastric mucosa.  Retroflexion of the endoscope revealed normal fundus and cardia.  Video photographs were obtained of the fundus, body and gastric antrum.  DUODENUM:  The duodenal bulb and descending duodenum were unremarkable. Multiple biopsies were taken from the descending duodenum to rule out intestinal atrophy or microscopic inflammatory bowel disease.  The patient tolerated the procedure well.  IMPRESSION:  Normal upper endoscopy ______  stomach and duodenum status post small bowel biopsies.  PLAN: 1. There is no evidence for organic lesions. 2. Await results from the biopsies. 3. The patient is to continue on Pepcid and all of her medications for Crohns    disease. DD:  06/04/99 TD:  06/04/99 Job: [id number]

## 2010-08-02 NOTE — Op Note (Signed)
NAME:  Jessica Cordova, Jessica Cordova                       ACCOUNT NO.:  [id number]   MEDICAL RECORD NO.:  [id number]                   PATIENT TYPE:  AMB   LOCATION:  [place]                                  FACILITY:  [facility]   PHYSICIAN:  Lenard Galloway, M.D.                DATE OF BIRTH:  12-Jun-1980   DATE OF PROCEDURE:  05/12/2003  DATE OF DISCHARGE:                                 OPERATIVE REPORT   PREOPERATIVE DIAGNOSES:  1. Cervical intraepithelial neoplasia II.  2. Vulvar intraepithelial neoplasia I.   POSTOPERATIVE DIAGNOSES:  1. Cervical intraepithelial neoplasia II.  2. Vulvar intraepithelial neoplasia I.   PROCEDURES:  1. Loop electrosurgical excision procedure conization of the cervix.  2. CO2 laser ablation of vulvar intraepithelial neoplasia I.   SURGEON:  Lenard Galloway, M.D.   ANESTHESIA:  LMA, local with 1% lidocaine with epinephrine 1:200,000 to the  cervix.   FLUIDS REPLACED:  400 mL Ringer's lactate.   ESTIMATED BLOOD LOSS:  Minimal.   URINE OUTPUT:  15 mL.   COMPLICATIONS:  None.   INDICATION FOR PROCEDURE:  The patient is a 30 year old gravida 42 Caucasian  female on Ortho Evra contraception, who was noted to have a Pap smear with  low-grade squamous intraepithelial lesion on March 15, 2003.  Colposcopy  performed April 13, 2003, documented both CIN-I and CIN-II of the  exocervix and the exocervix, which had evidence of CIN-II as well.  Colposcopy of the vulva at that time documented acetowhite changes of the  left labia minora and majora, which on biopsy was noted to be VIN-I.  A  discussion was held with the patient regarding the findings, and a decision  was made to proceed with a LEEP procedure and CO2 laser ablation of the  vulva after risks, benefits, and alternatives were discussed.   FINDINGS:  Examination of the cervix demonstrated no gross lesions.  The  vulva, including the left labia majora and extending down toward the  perineum and posterior  fourchette, documented a large area of acetowhite  changes, most thickened along the posterior fourchette and then at the level  of the mid- to inferior left labia majora and minora where they coalesced.   SPECIMENS:  Both the exocervix and the endocervix were sent to pathology.  Each were marked at the 12 o'clock position with a long tag on the suture at  the exocervix and a short tag on the suture of the endocervix, which were  marked at 12 o'clock.   DESCRIPTION OF PROCEDURE:  The patient was re-identified in the preoperative  hold area.  She was taken to the operating room, where LMA anesthesia was  induced.  She was placed in the dorsal lithotomy position.  The bladder was  I&O catheterized of urine as the OR staff did not know if the patient  emptied her bladder previous to the procedure.  A speculum was placed inside  the  vagina, and acetic acid was used to stain the cervix.  No gross lesions  were appreciated at this time.  Lugol's solution was placed on the cervix to  identify the transformation zone.  The cervix was then circumferentially  injected with 1% lidocaine with epinephrine 1:200,000.  With the setting of  60 with the cutting setting of the monopolar cautery, the LEEP procedure was  performed with a small loop and one single sweep.  The specimen was removed  and was oriented and set aside.  The endocervix was similarly treated with  the traditional cowboy hat square-shaped loop and the specimen was again  removed and oriented and set aside.  The coagulation ball was then used to  cauterize along the perimeter of the excision site, and hemostasis was  excellent.  The endocervix was not cauterized.  Monsel's was placed over the  operative site and the speculum was removed.  Colposcopy of the vulva was  performed next after the vulva was saturated with pads moistened with acetic  acid.  The findings are as noted above.  Laser ablation of the previously-  biopsied area  positive for VIN-I was then performed using a continuous pulse  of 15 watts of energy.  Excess char was wiped away.  The treatment was  carried down to the level of the dermis.  Silvadene cream was then placed  over the operative site.  This concluded the patient's procedure.  All CO2  laser protocols and precautions were followed.  All needle, instrument, and  sponge counts were correct.                                               Lenard Galloway, M.D.    BES/MEDQ  D:  05/12/2003  T:  05/12/2003  Job:  [id number]

## 2010-08-02 NOTE — Assessment & Plan Note (Signed)
[place] OFFICE NOTE   NAME:Molla, Jessica Cordova                  MRN:          [id number]  DATE:05/18/2006                            DOB:          July 06, 1980    Jessica Cordova is a 30 year old white female whom I have followed since age of  71 years  for inflammatory bowel disease/Crohn's colitis. I have not  seen her now for two years since her last colonoscopy in May 2006 which  showed essentially no evidence of active Crohn's disease. She has done  quite well for about a year and a half, but last several months has  started having crampy abdominal pain, diarrhea interspersed with  constipation. 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 Tessica discontinued her medications over a year ago. She was on  Asacol 3.6 g a day, Robinul Forte 14m mg, and Anusol suppositories. She  has been out of a job for two months and is looking forward to going  back to school to finish her college degree. She denies any nausea,  vomiting, reflux, weight loss, rectal bleeding. She has noticed mucus in  her stools. She has been highly anxious and unable to sleep. We had her  on Paxil 10 mg a day in the past, but she has discontinued it. She has  no insurance.   MEDICATIONS:  None.   PHYSICAL EXAMINATION:  VITAL SIGNS:  Blood pressure 110/50, pulse 88,  weight 125 pounds.  GENERAL:  She was alert, oriented, in no distress, healthy-appearing.  LUNGS:  Clear to auscultation.  COR:  With normal S1, normal S2.  ABDOMEN:  Soft, diffusely tender more so right and left lower quadrants,  no distention. Liver at edge of costal margin.  RECTAL:  Anuscopic exam shows normal-appearing anal area with tender  anal canal, superficial fissures, no deep scars of fibrous tissue. Stool  was Hemoccult negative. No evidence of proctitis.   IMPRESSION:  A  30year old white female with known Crohn's colitis who  has exacerbation of lower abdominal pain about one year after she  discontinued all her medications. Her symptoms, however, are more  typical for irritable bowel syndrome rather than Crohn's disease at this  point. Since she has no insurance, we will have to evaluate her in a  very cost effective way.   PLAN:  1. Belladonna with phenobarbital one p.o. b.i.d. as an antispasmodic.      [facility] for [facility] [address]  2. Instead of Asacol start Azulfidine 500 mg two p.o. b.i.d. to total      2.4 g with folic acid 1 mg daily.  3. Phenergan 25 mg, dispense #30. She can also get this prescription      for $4 in [facility].  4. Analpram cream samples given which will last her for several      months.  5. Blood tests today, CBC, sed rate, CMET, and TSH. Depending on the      results of these      blood tests,  we will decide if she needs further evaluation or even      resumption of her  6MP  which kept her in good control  in the      past.     Lowella Bandy. Olevia Perches, MD  Electronically Signed    DMB/MedQ  DD: 05/18/2006  DT: 05/18/2006  Job #: [id number]

## 2011-07-30 ENCOUNTER — Encounter: Payer: Self-pay | Admitting: *Deleted

## 2011-07-31 ENCOUNTER — Other Ambulatory Visit: Payer: Self-pay | Admitting: Obstetrics and Gynecology

## 2011-08-15 ENCOUNTER — Ambulatory Visit: Payer: Self-pay | Admitting: Internal Medicine

## 2011-09-19 ENCOUNTER — Encounter: Payer: Self-pay | Admitting: Internal Medicine

## 2011-09-19 ENCOUNTER — Ambulatory Visit (INDEPENDENT_AMBULATORY_CARE_PROVIDER_SITE_OTHER): Payer: Medicaid Other | Admitting: Internal Medicine

## 2011-09-19 ENCOUNTER — Other Ambulatory Visit (INDEPENDENT_AMBULATORY_CARE_PROVIDER_SITE_OTHER): Payer: Medicaid Other

## 2011-09-19 VITALS — BP 100/60 | HR 88 | Ht 61.0 in | Wt 134.4 lb

## 2011-09-19 DIAGNOSIS — K5090 Crohn's disease, unspecified, without complications: Secondary | ICD-10-CM

## 2011-09-19 DIAGNOSIS — R195 Other fecal abnormalities: Secondary | ICD-10-CM

## 2011-09-19 LAB — CBC WITH DIFFERENTIAL/PLATELET
Basophils Relative: 0.5 % (ref 0.0–3.0)
Eosinophils Relative: 1.8 % (ref 0.0–5.0)
HCT: 40.4 % (ref 36.0–46.0)
Hemoglobin: 13.5 g/dL (ref 12.0–15.0)
Lymphs Abs: 4.4 10*3/uL — ABNORMAL HIGH (ref 0.7–4.0)
MCV: 89.1 fl (ref 78.0–100.0)
Monocytes Absolute: 0.9 10*3/uL (ref 0.1–1.0)
Monocytes Relative: 7.5 % (ref 3.0–12.0)
RBC: 4.53 Mil/uL (ref 3.87–5.11)
WBC: 12.3 10*3/uL — ABNORMAL HIGH (ref 4.5–10.5)

## 2011-09-19 LAB — TSH: TSH: 0.44 u[IU]/mL (ref 0.35–5.50)

## 2011-09-19 MED ORDER — SULFASALAZINE 500 MG PO TABS: ORAL_TABLET | ORAL | Status: DC

## 2011-09-19 MED ORDER — LORAZEPAM 0.5 MG PO TABS: 0.5000 mg | ORAL_TABLET | Freq: Three times a day (TID) | ORAL | Status: AC | PRN

## 2011-09-19 MED ORDER — DICYCLOMINE HCL 10 MG PO CAPS: 10.0000 mg | ORAL_CAPSULE | Freq: Three times a day (TID) | ORAL | Status: DC

## 2011-09-19 MED ORDER — DICLOFENAC SODIUM 75 MG PO TBEC: 75.0000 mg | DELAYED_RELEASE_TABLET | Freq: Every day | ORAL | Status: AC

## 2011-09-19 MED ORDER — PROMETHAZINE HCL 25 MG PO TABS: ORAL_TABLET | ORAL | Status: DC

## 2011-09-19 MED ORDER — HYDROCORTISONE ACETATE 25 MG RE SUPP: 25.0000 mg | Freq: Every day | RECTAL | Status: AC

## 2011-09-19 MED ORDER — FOLIC ACID 1 MG PO TABS: 1.0000 mg | ORAL_TABLET | Freq: Every day | ORAL | Status: AC

## 2011-09-19 NOTE — Patient Instructions (Addendum)
We have sent the following medications to your pharmacy for you to pick up at your convenience: Bentyl Azulfadine Folic Acid Phenergan Diclofenac Anusol Ativan Your physician has requested that you go to the basement for the following lab work before leaving today: CBC, Sed Rate, TSH CC: Dr Cher Nakai

## 2011-09-19 NOTE — Progress Notes (Signed)
Jessica Cordova Jan 23, 1981 MRN [id number]   History of Present Illness:  This is a 31 year old white female with Crohn's colitis since July 1992 at age 63, who responded to steroids and later on to Remicade infusions. She has had numerous colonoscopies, the last one in May 2006 which was a normal exam but the biopsies showed quiescent colitis from 0-20 cm. A CT scan of the abdomen in December 2004 was normal. Remicade infusions were from 1999 to 2002. She was followed regularly until April 2008 when her insurance ran out. I saw her again in April 2011 when she had a flareup of colitis after vaginal delivery. She was put on Azulfidine 517m, but has discontinued it since then. She is now having crampy abdominal pain, diarrhea, constipation and  anxiety. She has a history of kidney stones and irritable bowel syndrome. 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   Past Medical History  Diagnosis Date  . Anxiety   . IBS (irritable bowel syndrome)   . Crohn's disease   . Migraines   . Renal colic   . Nephrolithiasis   . Pseudotumor cerebri    Past Surgical History  Procedure Date  . Tonsillectomy   . Foot surgery     left x 2  . Leep   . Wisdom tooth extraction     reports that she quit smoking about 4 years ago. She has never used smokeless tobacco. She reports that she does not drink alcohol or use illicit drugs. family history includes Colon cancer in her paternal aunt; Heart disease in her maternal grandfather and paternal grandfather; and Hypertension in her father. No Known Allergies      Review of Systems: Denies heartburn dysphagia chest pain shortness of breath  The remainder of the 10 point ROS is negative except as outlined in H&P   Physical Exam: General appearance  Well developed, in no distress. Eyes- non icteric. HEENT nontraumatic, normocephalic. Mouth no lesions, tongue  papillated, no cheilosis. Neck supple without adenopathy, thyroid not enlarged, no carotid bruits, no JVD. Lungs Clear to auscultation bilaterally. Cor normal S1, normal S2, regular rhythm, no murmur,  quiet precordium. Abdomen: Soft tender in epigastrium and left lower quadrant. No distention. No palpable mass. Liver edge at costal margin. Rectal: Lichen planus of the perirectal area and rectal crease, painful digital exam with trace Hemoccult-positive stool. Extremities no pedal edema. Skin no lesions. Neurological alert and oriented x 3. Psychological normal mood and affect.  Assessment and Plan:  Problem #1  History of Crohn's colitis. Her last flare up was several years ago. She also has a history of irritable bowel syndrome. Her symptoms currently are more consistent with IBS. We will start her on Azulfidine 500 mg 2 tablets a day with folic acid 1 mg daily. We will obtain lab work, give her Bentyl 10 mg a.c. and at bedtime when necessary. We will also give her Phenergan 25 mg to take 1/2 tablet every 4-6 hours when necessary for nausea. She will use Anusol-HC suppositories for rectal irritation. She should have a repeat colonoscopy at some point but at this time she is starting a new job and cannot afford to lose any time.  Problem #2 Anxiety and history of depression. We will give her a trial of Ativan 0.5 mg to use when necessary.  Problem #3 Polyarthralgias secondary to Crohn's disease. We will start her on diclofenac 75  mg daily.    09/19/2011 [name]

## 2011-09-20 ENCOUNTER — Encounter: Payer: Self-pay | Admitting: Internal Medicine

## 2012-08-10 ENCOUNTER — Other Ambulatory Visit: Payer: Self-pay | Admitting: Internal Medicine

## 2012-08-11 NOTE — Telephone Encounter (Signed)
Sent ativan to pt' pharmacy

## 2012-08-11 NOTE — Telephone Encounter (Signed)
Sent not to Dr. Olevia Perches regarding refill on Ativan. Waiting for reply

## 2012-12-06 ENCOUNTER — Other Ambulatory Visit: Payer: Self-pay | Admitting: Internal Medicine

## 2013-06-23 ENCOUNTER — Encounter: Payer: Self-pay | Admitting: *Deleted

## 2013-07-26 ENCOUNTER — Encounter: Payer: Self-pay | Admitting: Internal Medicine

## 2013-07-26 ENCOUNTER — Ambulatory Visit (INDEPENDENT_AMBULATORY_CARE_PROVIDER_SITE_OTHER): Payer: Medicaid Other | Admitting: Internal Medicine

## 2013-07-26 VITALS — BP 122/74 | HR 84 | Ht 61.0 in | Wt 137.0 lb

## 2013-07-26 DIAGNOSIS — K5090 Crohn's disease, unspecified, without complications: Secondary | ICD-10-CM

## 2013-07-26 DIAGNOSIS — R197 Diarrhea, unspecified: Secondary | ICD-10-CM

## 2013-07-26 MED ORDER — PROMETHAZINE HCL 25 MG PO TABS: 12.5000 mg | ORAL_TABLET | Freq: Four times a day (QID) | ORAL | Status: DC | PRN

## 2013-07-26 MED ORDER — DICYCLOMINE HCL 10 MG PO CAPS: 10.0000 mg | ORAL_CAPSULE | Freq: Three times a day (TID) | ORAL | Status: DC

## 2013-07-26 MED ORDER — SERTRALINE HCL 50 MG PO TABS: 50.0000 mg | ORAL_TABLET | Freq: Every day | ORAL | Status: DC

## 2013-07-26 MED ORDER — MOVIPREP 100 G PO SOLR: 1.0000 | Freq: Once | ORAL | Status: DC

## 2013-07-26 NOTE — Patient Instructions (Signed)
You have been scheduled for a colonoscopy with propofol. Please follow written instructions given to you at your visit today.  Please pick up your prep kit at the pharmacy within the next 1-3 days. If you use inhalers (even only as needed), please bring them with you on the day of your procedure. Your physician has requested that you go to www.startemmi.com and enter the access code given to you at your visit today. This web site gives a general overview about your procedure. However, you should still follow specific instructions given to you by our office regarding your preparation for the procedure.  We have sent the following medications to your pharmacy for you to pick up at your convenience: Phenergan Bentyl Zoloft  Dr Olevia Perches would like you to have the following labwork completed at Dr Marguerita Beards office when you have your physical: CBC, CMET, TSH, Sed, IBC, B12  CC:Dr Edward Jolly

## 2013-07-26 NOTE — Progress Notes (Signed)
CHOUA IKNER Aug 16, 1980 [id number]  Note: This dictation was prepared with Dragon digital system. Any transcriptional errors that result from this procedure are unintentional.   History of Present Illness:  This is a 33 year old white female with a history of Crohn's colitis diagnosed in July 1992 at age 42. Her last appointment was in July 2013. She was in the past treated with steroids and later on with Remicade from 1990-2002. Her last colonoscopy in May 2006 showed crohn's colitis from 0-20 cm. A CT scan of the abdomen in 2004 was normal. She was followed regularly until 2008 when she lost her insurance and since then has not been followed on a regular basis. She had a flareup of colitis in April 2011 after a vaginal delivery. She is not currently on any medications. She complains of crampy abdominal pain and diarrhea but no rectal bleeding. Her weight has been stable. She has anxiety.    Past Medical History  Diagnosis Date  . Anxiety   . IBS (irritable bowel syndrome)   . Crohn's disease   . Migraines   . Renal colic   . Nephrolithiasis   . Pseudotumor cerebri   . Neutrophilic leukocytosis     Past Surgical History  Procedure Laterality Date  . Tonsillectomy    . Foot surgery      left x 2  . Leep    . Wisdom tooth extraction      No Known Allergies  Family history and social history have been reviewed.  Review of Systems: Nice nausea or vomiting. Occasional food intolerance  The remainder of the 10 point ROS is negative except as outlined in the H&P  Physical Exam: General Appearance Well developed, in no distress Eyes  Non icteric  HEENT  Non traumatic, normocephalic  Mouth No lesion, tongue papillated, no cheilosis Neck Supple without adenopathy, thyroid not enlarged, no carotid bruits, no JVD Lungs Clear to auscultation bilaterally COR Normal S1, normal S2, regular rhythm, no murmur, quiet precordium Abdomen soft with mild diffuse tenderness.  Normoactive bowel sounds. Right lower quadrant was normal Rectal not done Extremities  No pedal edema Skin No lesions Neurological Alert and oriented x 3 Psychological Normal mood and affect  Assessment and Plan:   Problem #1 History of Crohn's colitis now with an exacerbation of  diarrhea, abdominal pain and bloating which are suggestive of irritable bowel syndrome versus low-grade colitis. She is due to have a colonoscopy to determine the activity and extent of the disease. We will start Bentyl 10 mg 3 times a day and refill her Phenergan. We will start Zoloft 50 mg daily for anxiety. Depending on the findings at the colonoscopy, she will likely need to go back on mesalamine. She is scheduled to have a physical exam with her PCP, Dr. Truman Hayward and will have followup blood tests including a sedimentation rate, metabolic panel, CBC, TSH, iron studies and B12. Depending on the results of the colonoscopy, we may also consider an upper abdominal ultrasound.    [place] Dragon 07/26/2013

## 2013-08-02 ENCOUNTER — Telehealth: Payer: Self-pay | Admitting: Internal Medicine

## 2013-08-02 NOTE — Telephone Encounter (Signed)
Spoke with patient and she states she took the Zoloft 50 mg twice. She does not like how it makes her feel. States she cannot function because she is spaced out and jittery. She is asking if she can try 25 mg/daily. Please, advise.

## 2013-08-02 NOTE — Telephone Encounter (Signed)
She was not supposed to take Zoloft twice a day, but only once a day. It is OK for her to start with Zoloft 25 mg  1 po qd x 2 weeks and then she can go up to 50 mg/day.

## 2013-08-03 NOTE — Telephone Encounter (Signed)
Patient notified of recommendation. To clarify, she took the Zoloft 50 mg for two days and felt bad.

## 2013-08-31 ENCOUNTER — Telehealth: Payer: Self-pay | Admitting: Internal Medicine

## 2013-08-31 DIAGNOSIS — K5010 Crohn's disease of large intestine without complications: Secondary | ICD-10-CM

## 2013-08-31 NOTE — Telephone Encounter (Signed)
Spoke with patient and she would like to have her labs done at our office. Her PCP did not want to do the labs for her. Labs in EPIC.

## 2013-09-01 ENCOUNTER — Other Ambulatory Visit (INDEPENDENT_AMBULATORY_CARE_PROVIDER_SITE_OTHER): Payer: Medicaid Other

## 2013-09-01 ENCOUNTER — Other Ambulatory Visit: Payer: Self-pay

## 2013-09-01 DIAGNOSIS — K5010 Crohn's disease of large intestine without complications: Secondary | ICD-10-CM

## 2013-09-01 LAB — CBC WITH DIFFERENTIAL/PLATELET
Basophils Absolute: 0.1 10*3/uL (ref 0.0–0.1)
Basophils Relative: 0.6 % (ref 0.0–3.0)
EOS PCT: 1.4 % (ref 0.0–5.0)
Eosinophils Absolute: 0.2 10*3/uL (ref 0.0–0.7)
HEMATOCRIT: 39.9 % (ref 36.0–46.0)
HEMOGLOBIN: 13.5 g/dL (ref 12.0–15.0)
LYMPHS ABS: 3.9 10*3/uL (ref 0.7–4.0)
Lymphocytes Relative: 30.4 % (ref 12.0–46.0)
MCHC: 33.8 g/dL (ref 30.0–36.0)
MCV: 87.9 fl (ref 78.0–100.0)
MONOS PCT: 9.1 % (ref 3.0–12.0)
Monocytes Absolute: 1.2 10*3/uL — ABNORMAL HIGH (ref 0.1–1.0)
Neutro Abs: 7.5 10*3/uL (ref 1.4–7.7)
Neutrophils Relative %: 58.5 % (ref 43.0–77.0)
Platelets: 560 10*3/uL — ABNORMAL HIGH (ref 150.0–400.0)
RBC: 4.54 Mil/uL (ref 3.87–5.11)
RDW: 14.7 % (ref 11.5–15.5)
WBC: 12.9 10*3/uL — AB (ref 4.0–10.5)

## 2013-09-01 LAB — COMPREHENSIVE METABOLIC PANEL
ALT: 16 U/L (ref 0–35)
AST: 16 U/L (ref 0–37)
Albumin: 4.4 g/dL (ref 3.5–5.2)
Alkaline Phosphatase: 58 U/L (ref 39–117)
BILIRUBIN TOTAL: 0.4 mg/dL (ref 0.2–1.2)
BUN: 10 mg/dL (ref 6–23)
CALCIUM: 9.2 mg/dL (ref 8.4–10.5)
CHLORIDE: 106 meq/L (ref 96–112)
CO2: 25 meq/L (ref 19–32)
Creatinine, Ser: 0.5 mg/dL (ref 0.4–1.2)
GFR: 155.32 mL/min (ref >60.00)
GLUCOSE: 94 mg/dL (ref 70–99)
Potassium: 3.8 mEq/L (ref 3.5–5.1)
Sodium: 138 mEq/L (ref 135–145)
TOTAL PROTEIN: 7.2 g/dL (ref 6.0–8.3)

## 2013-09-01 LAB — IBC PANEL
Iron: 117 ug/dL (ref 42–145)
Saturation Ratios: 33.6 % (ref 20.0–50.0)
TRANSFERRIN: 248.9 mg/dL (ref 212.0–360.0)

## 2013-09-01 LAB — VITAMIN B12: Vitamin B-12: 293 pg/mL (ref 211–911)

## 2013-09-01 LAB — TSH: TSH: 0.63 u[IU]/mL (ref 0.35–4.50)

## 2013-09-01 LAB — SEDIMENTATION RATE: SED RATE: 13 mm/h (ref 0–22)

## 2013-09-12 ENCOUNTER — Telehealth: Payer: Self-pay | Admitting: Internal Medicine

## 2013-09-12 NOTE — Telephone Encounter (Signed)
Pt. Wanted to know if she could continue to take her Bentyl for cramping and if she could mix her prep with gatorade ? Instructed pt. To continue taking medication and to mix prep with water as prescribed,she verbalized understanding.

## 2013-09-13 ENCOUNTER — Ambulatory Visit (AMBULATORY_SURGERY_CENTER): Payer: Medicaid Other | Admitting: Internal Medicine

## 2013-09-13 ENCOUNTER — Encounter: Payer: Self-pay | Admitting: Internal Medicine

## 2013-09-13 VITALS — BP 103/69 | HR 83 | Temp 97.7°F | Resp 16 | Ht 61.0 in | Wt 137.0 lb

## 2013-09-13 DIAGNOSIS — R197 Diarrhea, unspecified: Secondary | ICD-10-CM

## 2013-09-13 DIAGNOSIS — K5090 Crohn's disease, unspecified, without complications: Secondary | ICD-10-CM

## 2013-09-13 DIAGNOSIS — K5289 Other specified noninfective gastroenteritis and colitis: Secondary | ICD-10-CM

## 2013-09-13 MED ORDER — LORAZEPAM 0.5 MG PO TABS: 0.5000 mg | ORAL_TABLET | Freq: Four times a day (QID) | ORAL | Status: AC | PRN

## 2013-09-13 MED ORDER — SODIUM CHLORIDE 0.9 % IV SOLN: 500.0000 mL | INTRAVENOUS | Status: DC

## 2013-09-13 NOTE — Progress Notes (Signed)
Patient stable, vss, report to sheila, rn

## 2013-09-13 NOTE — Progress Notes (Signed)
Called to room to assist during endoscopic procedure.  Patient ID and intended procedure confirmed with present staff. Received instructions for my participation in the procedure from the performing physician.  

## 2013-09-13 NOTE — Op Note (Signed)
[facility]  [address] [place], [id number]   COLONOSCOPY PROCEDURE REPORT  PATIENT: Jessica Cordova, Jessica Cordova  MR#: [id number] BIRTHDATE: 11/02/1980 , 33  yrs. old GENDER: Female ENDOSCOPIST: Lafayette Dragon, MD REFERRED BY:Dr Cher Nakai PROCEDURE DATE:  09/13/2013 PROCEDURE:   Colonoscopy with biopsy First Screening Colonoscopy - Avg.  risk and is 50 yrs.  old or older - No.  Prior Negative Screening - Now for repeat screening. N/A  History of Adenoma - Now for follow-up colonoscopy & has been > or = to 3 yrs.  N/A  Polyps Removed Today? No.  Recommend repeat exam, <10 yrs? Yes.  High risk (family or personal hx). ASA CLASS:   Class II INDICATIONS:history of Crohn's colitis since age can since 31. Last colonoscopy in May 2006 showed active colitis from 0-20 cm. Last flareup in April 2011.  Patient has been lost to followup. She is currently on no medications. MEDICATIONS: MAC sedation, administered by CRNA and propofol (Diprivan) 352m IV  DESCRIPTION OF PROCEDURE:   After the risks benefits and alternatives of the procedure were thoroughly explained, informed consent was obtained.  A digital rectal exam revealed no abnormalities of the rectum.   The LB [id number]-H190 [id number] endoscope was introduced through the anus and advanced to the terminal ileum which was intubated for a short distance. No adverse events experienced.   The quality of the prep was good, using MoviPrep The instrument was then slowly withdrawn as the colon was fully examined.      COLON FINDINGS: Abnormal mucosa was found in the descending colon. The mucosa was erythematous.there were no ulcerations or pseudopolyps. The vascularity was decrease but there was no friability. Biopsies were obtained throughout the colon including terminal ileum. Terminal ileum appeared normal. Cecal pouch and a descending colon were normal. The were no diverticuli.  This was likely consistent with Crohn's  disease.  Multiple random biopsies of the area were performed using cold forceps.  Retroflexion was not performed due to a narrow rectal vault. The time to cecum=4 minutes 18 seconds.  Withdrawal time=10 minutes 56 seconds.  The scope was withdrawn and the procedure completed. COMPLICATIONS: There were no complications.  ENDOSCOPIC IMPRESSION: Abnormal mucosa was found in the descending colon; multiple random biopsies of the area were performed using cold forceps ,findings are consistent with mild nonspecific colitis  RECOMMENDATIONS: 1.  Await pathology results 2.  Continue current medications 3.   depending on the biopsy results we will consider using mesalamine   eSigned:  DLafayette Dragon MD 09/13/2013 3:18 PM   cc:

## 2013-09-13 NOTE — Patient Instructions (Addendum)
YOU HAD AN ENDOSCOPIC PROCEDURE TODAY AT THE [facility]: Refer to the procedure report that was given to you for any specific questions about what was found during the examination.  If the procedure report does not answer your questions, please call your gastroenterologist to clarify.  If you requested that your care partner not be given the details of your procedure findings, then the procedure report has been included in a sealed envelope for you to review at your convenience later.  YOU SHOULD EXPECT: Some feelings of bloating in the abdomen. Passage of more gas than usual.  Walking can help get rid of the air that was put into your GI tract during the procedure and reduce the bloating. If you had a lower endoscopy (such as a colonoscopy or flexible sigmoidoscopy) you may notice spotting of blood in your stool or on the toilet paper. If you underwent a bowel prep for your procedure, then you may not have a normal bowel movement for a few days.  DIET: Your first meal following the procedure should be a light meal and then it is ok to progress to your normal diet.  A half-sandwich or bowl of soup is an example of a good first meal.  Heavy or fried foods are harder to digest and may make you feel nauseous or bloated.  Likewise meals heavy in dairy and vegetables can cause extra gas to form and this can also increase the bloating.  Drink plenty of fluids but you should avoid alcoholic beverages for 24 hours.  ACTIVITY: Your care partner should take you home directly after the procedure.  You should plan to take it easy, moving slowly for the rest of the day.  You can resume normal activity the day after the procedure however you should NOT DRIVE or use heavy machinery for 24 hours (because of the sedation medicines used during the test).    SYMPTOMS TO REPORT IMMEDIATELY: A gastroenterologist can be reached at any hour.  During normal business hours, 8:30 AM to 5:00 PM Monday through Friday,  call [phone].  After hours and on weekends, please call the GI answering service at [phone] who will take a message and have the physician on call contact you.   Following lower endoscopy (colonoscopy or flexible sigmoidoscopy):  Excessive amounts of blood in the stool  Significant tenderness or worsening of abdominal pains  Swelling of the abdomen that is new, acute  Fever of 100F or higher  FOLLOW UP: If any biopsies were taken you will be contacted by phone or by letter within the next 1-3 weeks.  Call your gastroenterologist if you have not heard about the biopsies in 3 weeks.  Our staff will call the home number listed on your records the next business day following your procedure to check on you and address any questions or concerns that you may have at that time regarding the information given to you following your procedure. This is a courtesy call and so if there is no answer at the home number and we have not heard from you through the emergency physician on call, we will assume that you have returned to your regular daily activities without incident.  SIGNATURES/CONFIDENTIALITY: You and/or your care partner have signed paperwork which will be entered into your electronic medical record.  These signatures attest to the fact that that the information above on your After Visit Summary has been reviewed and is understood.  Full responsibility of the confidentiality of this   discharge information lies with you and/or your care-partner.  Resume medications. 

## 2013-09-14 ENCOUNTER — Telehealth: Payer: Self-pay | Admitting: *Deleted

## 2013-09-14 NOTE — Telephone Encounter (Signed)
  Follow up Call-  Call back number 09/13/2013  Post procedure Call Back phone  # [phone]  Permission to leave phone message Yes     Patient questions:  Left message to call us if necessary.

## 2013-09-20 ENCOUNTER — Encounter: Payer: Self-pay | Admitting: Internal Medicine

## 2013-09-21 ENCOUNTER — Other Ambulatory Visit: Payer: Self-pay | Admitting: *Deleted

## 2013-09-21 MED ORDER — MESALAMINE 1.2 G PO TBEC: DELAYED_RELEASE_TABLET | ORAL | Status: DC

## 2013-09-22 ENCOUNTER — Encounter: Payer: Self-pay | Admitting: *Deleted

## 2013-09-28 ENCOUNTER — Telehealth: Payer: Self-pay | Admitting: Internal Medicine

## 2013-09-28 MED ORDER — MESALAMINE ER 0.375 G PO CP24: 375.0000 mg | ORAL_CAPSULE | Freq: Every day | ORAL | Status: DC

## 2013-09-28 MED ORDER — MESALAMINE ER 0.375 G PO CP24: 750.0000 mg | ORAL_CAPSULE | Freq: Two times a day (BID) | ORAL | Status: DC

## 2013-09-28 NOTE — Telephone Encounter (Signed)
I sent a prior authorization form through [email or web address] on 09/21/13. They denied the claim, however, they did not advise [place] or the patient of their decision. Per [employer], patient must have tried and failed Apriso, balsalazide and sulfasalazine (any 2 of this combination) to be eligible for coverage for Lialda. Patient has tried azulfadine in the past. Dr Olevia Perches, would you be okay with patient trying Apriso?

## 2013-09-28 NOTE — Telephone Encounter (Signed)
Please try Apriso 375 mg,#120 2 po bid, 6 refills

## 2013-09-28 NOTE — Telephone Encounter (Signed)
I have advised patient of Dr Nichola Sizer recommendation to try Apriso. Rx was sent to pharmacy and per pharmacy will be $3.00 per month. She verbalizes understanding. I will also provide her with Apriso samples since the pharmacy does not currently have the [place] in stock.

## 2013-11-18 ENCOUNTER — Other Ambulatory Visit: Payer: Self-pay | Admitting: Obstetrics and Gynecology

## 2013-11-22 ENCOUNTER — Encounter: Payer: Self-pay | Admitting: Internal Medicine

## 2013-11-22 ENCOUNTER — Ambulatory Visit (INDEPENDENT_AMBULATORY_CARE_PROVIDER_SITE_OTHER): Payer: Medicaid Other | Admitting: Internal Medicine

## 2013-11-22 VITALS — BP 100/70 | HR 76 | Ht 61.0 in | Wt 137.6 lb

## 2013-11-22 DIAGNOSIS — K5010 Crohn's disease of large intestine without complications: Secondary | ICD-10-CM

## 2013-11-22 DIAGNOSIS — K5000 Crohn's disease of small intestine without complications: Secondary | ICD-10-CM

## 2013-11-22 LAB — CYTOLOGY - PAP

## 2013-11-22 MED ORDER — BUDESONIDE 3 MG PO CP24: 9.0000 mg | ORAL_CAPSULE | Freq: Every day | ORAL | Status: DC

## 2013-11-22 NOTE — Progress Notes (Signed)
Jessica Cordova [birth year] [id number]  Note: This dictation was prepared with Dragon digital system. Any transcriptional errors that result from this procedure are unintentional.   History of Present Illness:  This is a 33 year old white female with Crohn's colitis and  ileitis since 49. Her last appointment with [place] was on 07/26/2013. She was started on Apriso 0.375 mg 2 twice a day after her colonoscopy on 09/13/2013 showed patchy chronic minimal active ileitis. Right colon biopsies are normal. The descending colon showed patchy chronic mild active colitis. She is feeling  better on mesalamine but is still having some bloating, diarrhea and food intolerance. She was on Remicade infusions in 2001 and 2002. She was then lost to followup and not take any treatment for several years until she came back earlier this year. She is currently on medicaid but this will run out at the end of October 2015.   She is worried that she will not be able to afford biologicals. A prior colonoscopy in May 2006 showed active disease to 20 cm.    Past Medical History  Diagnosis Date  . Anxiety   . IBS (irritable bowel syndrome)   . Crohn's disease   . Migraines   . Renal colic   . Nephrolithiasis   . Pseudotumor cerebri   . Neutrophilic leukocytosis     Past Surgical History  Procedure Laterality Date  . Tonsillectomy    . Foot surgery      left x 2  . Leep    . Wisdom tooth extraction      No Known Allergies  Family history and social history have been reviewed.  Review of Systems:   The remainder of the 10 point ROS is negative except as outlined in the H&P  Physical Exam: General Appearance Well developed, in no distress Eyes  Non icteric  HEENT  Non traumatic, normocephalic  Mouth No lesion, tongue papillated, no cheilosis Neck Supple without adenopathy, thyroid not enlarged, no carotid bruits, no JVD Lungs Clear to auscultation bilaterally COR Normal S1, normal S2, regular  rhythm, no murmur, quiet precordium Abdomen soft with normoactive bowel sounds. Mild tenderness in right lower quadrant. No  rebound no fullness Rectal not done Extremities  No pedal edema Skin No lesions Neurological Alert and oriented x 3 Psychological Normal mood and affect  Assessment and Plan:   Problem #44 33 year old white female with Crohn's colitis and ileitis who is minimally symptomatic but will need additional therapy for long-term control of the disease. The insurance is a problem especially if we try to put her back on biologicals. Currently, we will start her on Entocort 9 mg daily for the next 8-12 weeks and she will come back in 6 weeks to discuss. She did very well on Remicade in the past and we may need to search for a patient assistance program which would allow her to receive biologicals.    [name] 11/22/2013

## 2013-11-22 NOTE — Patient Instructions (Addendum)
We have sent the following medications to your pharmacy for you to pick up at your convenience:Entocort.  Your follow up appointment with Dr. Olevia Perches is scheduled for 01/03/14 at 8:15am. If you need to reschedule or cancel please call [phone]. Dr Cher Nakai

## 2013-12-06 ENCOUNTER — Telehealth: Payer: Self-pay | Admitting: *Deleted

## 2013-12-06 NOTE — Telephone Encounter (Signed)
I have advised [facility]. They will run Entocort name brand instead.

## 2013-12-06 NOTE — Telephone Encounter (Signed)
[employer] has finally responded to our request for prior authorization on Budesonide. They have denied the request as "the brand medication is the preferred agent, and does not require prior authorization." I will contact the pharmacy when they open today to request that they run the medication through as name brand.

## 2014-01-03 ENCOUNTER — Encounter: Payer: Self-pay | Admitting: Internal Medicine

## 2014-01-03 ENCOUNTER — Ambulatory Visit (INDEPENDENT_AMBULATORY_CARE_PROVIDER_SITE_OTHER): Payer: Medicaid Other | Admitting: Internal Medicine

## 2014-01-03 VITALS — BP 100/60 | HR 76 | Ht 61.0 in | Wt 140.0 lb

## 2014-01-03 DIAGNOSIS — K5010 Crohn's disease of large intestine without complications: Secondary | ICD-10-CM

## 2014-01-03 DIAGNOSIS — K5000 Crohn's disease of small intestine without complications: Secondary | ICD-10-CM

## 2014-01-03 NOTE — Patient Instructions (Signed)
Please decrease Entocort to 6 mg (2 tablets) daily x 6 weeks (until 02/13/14). Then, decrease to 3 mg (1 tablet) daily x 6 weeks.  Please continue Apriso 2 tablets twice daily.  Please follow up with Dr Olevia Perches in 2 months.  CC:Dr Cher Nakai

## 2014-01-03 NOTE — Progress Notes (Signed)
Jessica Cordova 03-Sep-1980 [id number]  Note: This dictation was prepared with Dragon digital system. Any transcriptional errors that result from this procedure are unintentional.   History of Present Illness:  This is a 33 year old white female with Crohn's colitis and ileitis since 29. She was lost to followup for several years but has come back since June 2015. Her colonoscopy in June 2015 showed ileitis as well as mild colitis. Biopsies confirmed chronic minimally active ileitis and patchy minimally active colitis. The sigmoid colon biopsies and right colon biopsies were normal. She used to be on Remicade in 2001 in 2002. She is currently on mesalamine 1.5 g daily and Entocort 9 mg daily. She has done much better on Entocort and is ready to reduce it. She denies rectal bleeding, abdominal pain or diarrhea except for 1 or 2 episodes in the last 6 weeks.    Past Medical History  Diagnosis Date  . Anxiety   . IBS (irritable bowel syndrome)   . Crohn's disease   . Migraines   . Renal colic   . Nephrolithiasis   . Pseudotumor cerebri   . Neutrophilic leukocytosis     Past Surgical History  Procedure Laterality Date  . Tonsillectomy    . Foot surgery      left x 2  . Leep    . Wisdom tooth extraction      No Known Allergies  Family history and social history have been reviewed.  Review of Systems: Denies nausea vomiting. Positive for weight gain  The remainder of the 10 point ROS is negative except as outlined in the H&P  Physical Exam: General Appearance Well developed, in no distress Eyes  Non icteric  HEENT  Non traumatic, normocephalic  Mouth No lesion, tongue papillated, no cheilosis Neck Supple without adenopathy, thyroid not enlarged, no carotid bruits, no JVD Lungs Clear to auscultation bilaterally COR Normal S1, normal S2, regular rhythm, no murmur, quiet precordium Abdomen soft nontender with normal active bowel sounds. No distention. Rectal not  done Extremities  No pedal edema Skin No lesions Neurological Alert and oriented x 3 Psychological Normal mood and affect  Assessment and Plan:   Problem #51 33 year old white female with Crohn's colitis and ileitis who has improved on Entocort and mesalamine. She will be switching insurance at the end of this month and we will see which of her medications will be covered. We will consider using biologicals again in the future depending on her new insurance. She is ready to reduce Entocort to 6 mg daily for 6 weeks then down to 3 mg daily for 6 weeks. I will see her in 2 months.    [name] 01/03/2014

## 2014-04-11 ENCOUNTER — Encounter: Payer: Self-pay | Admitting: Internal Medicine

## 2014-04-11 ENCOUNTER — Ambulatory Visit (INDEPENDENT_AMBULATORY_CARE_PROVIDER_SITE_OTHER): Payer: 59 | Admitting: Internal Medicine

## 2014-04-11 ENCOUNTER — Other Ambulatory Visit: Payer: 59

## 2014-04-11 ENCOUNTER — Telehealth: Payer: Self-pay

## 2014-04-11 VITALS — BP 90/60 | HR 88 | Ht 61.0 in | Wt 142.0 lb

## 2014-04-11 DIAGNOSIS — K50119 Crohn's disease of large intestine with unspecified complications: Secondary | ICD-10-CM

## 2014-04-11 DIAGNOSIS — K5000 Crohn's disease of small intestine without complications: Secondary | ICD-10-CM

## 2014-04-11 MED ORDER — MESALAMINE 1.2 G PO TBEC: 4.8000 g | DELAYED_RELEASE_TABLET | Freq: Every day | ORAL | Status: DC

## 2014-04-11 MED ORDER — DICLOFENAC SODIUM 75 MG PO TBEC
75.0000 mg | DELAYED_RELEASE_TABLET | Freq: Two times a day (BID) | ORAL | Status: DC
Start: 2014-04-11 — End: 2014-11-03

## 2014-04-11 MED ORDER — ADALIMUMAB 40 MG/0.8ML ~~LOC~~ AJKT: 40.0000 mg | AUTO-INJECTOR | Freq: Once | SUBCUTANEOUS | Status: DC

## 2014-04-11 MED ORDER — ADALIMUMAB 40 MG/0.8ML ~~LOC~~ AJKT: 40.0000 mg | AUTO-INJECTOR | SUBCUTANEOUS | Status: DC

## 2014-04-11 MED ORDER — DICYCLOMINE HCL 10 MG PO CAPS: 10.0000 mg | ORAL_CAPSULE | Freq: Three times a day (TID) | ORAL | Status: DC

## 2014-04-11 NOTE — Patient Instructions (Signed)
We have sent  medications to your pharmacy for you to pick up at your convenience. Your physician has requested that you go to the basement for  lab work before leaving today. Rollene Fare will contact you regarding your Humira. CC:  Cher Nakai MD

## 2014-04-11 NOTE — Telephone Encounter (Signed)
I have sent the referral for Humira to her [facility] pharmacy.  I have notified her that she should hear from them in the next 48 hours.  I explained that she will need to provide them when they call with a credit card number to pay her co-pay for the Humira.  I also explained that they will enroll her in the [employer] program to assist with teaching,. Rx was sent to [facility].  She is provided the number to [facility] to call for any questions or concerns

## 2014-04-11 NOTE — Progress Notes (Signed)
TRACI GAFFORD 16-Dec-1980 [id number]  Note: This dictation was prepared with Dragon digital system. Any transcriptional errors that result from this procedure are unintentional.   History of Present Illness: This is a 34 year old white female with Crohn's colitis and ileitis as of last colonoscopy in June 2015. She was diagnosed in 1992 and was followed until 2007 when she lost her insurance and we did  not see her. She was  on Remicade between 2001 and 2003. She now has an insurance which  has changed in the last 2 months. Because of the flareup of the disease she was started on budesonide 9 mg daily which was slowly taper down  To 6 mg and subsequently to 3 mg.with only temporary improvement of her diarrhea and rectal bleeding. She was also on mesalamine Apriso.375 mg to twice a day but is currently offmedication and has not been on any  for past 2 months. She describes crampy abdominal pain. She has missed the fewdays of work    Past Medical History  Diagnosis Date  . Anxiety   . IBS (irritable bowel syndrome)   . Crohn's disease   . Migraines   . Renal colic   . Nephrolithiasis   . Pseudotumor cerebri   . Neutrophilic leukocytosis     Past Surgical History  Procedure Laterality Date  . Tonsillectomy    . Foot surgery      left x 2  . Leep    . Wisdom tooth extraction      No Known Allergies  Family history and social history have been reviewed.  Review of Systems: Positive for crampy abdominal pain, nausea. Diarrhea low-volume rectal bleeding denies fever Positive for low back pain  The remainder of the 10 point ROS is negative except as outlined in the H&P  Physical Exam: General Appearance Well developed, in no distress Eyes  Non icteric  HEENT  Non traumatic, normocephalic  Mouth No lesion, tongue papillated, no cheilosis Neck Supple without adenopathy, thyroid not enlarged, no carotid bruits, no JVD Lungs Clear to auscultation bilaterally COR Normal S1,  normal S2, regular rhythm, no murmur, quiet precordium Abdomen soft nontender with normoactive bowel sounds. No distention Rectal not repeated Extremities  No pedal edema Skin No lesions Neurological Alert and oriented x 3 Psychological Normal mood and affect  Assessment and Plan:   34 year old white female with Crohn's disease disease of the terminal ileum and colon since 1992. Recent exacerbation. Colonoscopy in June 2015 confirms ileitis and colitis. She has been very erratic  taking her medications because of lack of insurance and most recently switching from 1 insurance to another. She will need to get back on biologicals. Her insurance will cover Humira. He will check QuanteferonTB TEST ,pre CERTIFY HER FOR HUMIRA, INCLUDING INDUCTION regimen.. followedby 40 MG EVERY 2 weeks.. IN THE MEANTIME WE WILL START Lialda .1.2 G, 4 TABLETS DAILY AND DICYCLOMINE 10 MG 3 TIMES A DAY FOR CRAMPS    [name] @TODAY (<PARAMETER> error)@

## 2014-04-12 ENCOUNTER — Telehealth: Payer: Self-pay | Admitting: *Deleted

## 2014-04-12 NOTE — Telephone Encounter (Signed)
[employer] has covered patient's Humira until 04/13/15. [id number].

## 2014-04-14 LAB — QUANTIFERON TB GOLD ASSAY (BLOOD)
Interferon Gamma Release Assay: NEGATIVE
MITOGEN VALUE: 8.81 [IU]/mL
QUANTIFERON NIL VALUE: 0.02 [IU]/mL
QUANTIFERON TB AG MINUS NIL: 0 [IU]/mL
TB Ag value: 0.02 IU/mL

## 2014-04-14 NOTE — Telephone Encounter (Signed)
Patient notified that she should here from [facility] by next week.  She is provided the number to [facility] to call if she does not hear from them by Tuesday

## 2014-04-14 NOTE — Telephone Encounter (Signed)
[facility] called and Humira is approved.  They need to transfer to [employer] and will set the patient up with [employer].

## 2014-04-26 ENCOUNTER — Telehealth: Payer: Self-pay | Admitting: Internal Medicine

## 2014-04-26 NOTE — Telephone Encounter (Signed)
I agree, they should have contacted her. If not by  Friday, she should come to our office and see if somebody could show her.

## 2014-04-26 NOTE — Telephone Encounter (Signed)
Spoke with patient and she has received her Humira medication. Verified with [facility] that patient has been enrolled in the [facility] program. Notified patient that she will be receiving a call from the nurse to set up injection training. Patient understands to call me if she does not hear from them in the next few days.

## 2014-09-28 ENCOUNTER — Telehealth: Payer: Self-pay | Admitting: *Deleted

## 2014-09-28 NOTE — Telephone Encounter (Signed)
Spoke with patient and her insurance will not cover Lialda. It will cover Apriso. Please, advise.

## 2014-09-28 NOTE — Telephone Encounter (Signed)
She was on Apriso last summer, please send Apriso .375 mg, 2 po bid, #120, 6 refills, I hope she if off Budesonide? Continue Humira.Should have a 6 month appointment.

## 2014-09-29 MED ORDER — MESALAMINE ER 0.375 G PO CP24: 750.0000 mg | ORAL_CAPSULE | Freq: Two times a day (BID) | ORAL | Status: DC

## 2014-09-29 NOTE — Telephone Encounter (Signed)
Spoke with patient and gave her recommendation. New Rx sent. She is off Budesonide. Scheduled OV on 12/06/14.

## 2014-11-03 ENCOUNTER — Encounter: Payer: Self-pay | Admitting: Internal Medicine

## 2014-11-03 ENCOUNTER — Other Ambulatory Visit (INDEPENDENT_AMBULATORY_CARE_PROVIDER_SITE_OTHER): Payer: 59

## 2014-11-03 ENCOUNTER — Ambulatory Visit (INDEPENDENT_AMBULATORY_CARE_PROVIDER_SITE_OTHER): Payer: 59 | Admitting: Internal Medicine

## 2014-11-03 VITALS — BP 110/60 | HR 82 | Ht 61.0 in | Wt 145.0 lb

## 2014-11-03 DIAGNOSIS — K50119 Crohn's disease of large intestine with unspecified complications: Secondary | ICD-10-CM

## 2014-11-03 DIAGNOSIS — K5090 Crohn's disease, unspecified, without complications: Secondary | ICD-10-CM

## 2014-11-03 DIAGNOSIS — K5000 Crohn's disease of small intestine without complications: Secondary | ICD-10-CM

## 2014-11-03 LAB — CBC WITH DIFFERENTIAL/PLATELET
BASOS ABS: 0 10*3/uL (ref 0.0–0.1)
Basophils Relative: 0.3 % (ref 0.0–3.0)
EOS ABS: 0.2 10*3/uL (ref 0.0–0.7)
Eosinophils Relative: 1.6 % (ref 0.0–5.0)
HEMATOCRIT: 43.3 % (ref 36.0–46.0)
Hemoglobin: 14.7 g/dL (ref 12.0–15.0)
LYMPHS PCT: 30.1 % (ref 12.0–46.0)
Lymphs Abs: 3.3 10*3/uL (ref 0.7–4.0)
MCHC: 33.9 g/dL (ref 30.0–36.0)
MCV: 88.5 fl (ref 78.0–100.0)
MONO ABS: 1.3 10*3/uL — AB (ref 0.1–1.0)
Monocytes Relative: 12.3 % — ABNORMAL HIGH (ref 3.0–12.0)
NEUTROS ABS: 6 10*3/uL (ref 1.4–7.7)
Neutrophils Relative %: 55.7 % (ref 43.0–77.0)
PLATELETS: 594 10*3/uL — AB (ref 150.0–400.0)
RBC: 4.89 Mil/uL (ref 3.87–5.11)
RDW: 14.1 % (ref 11.5–15.5)
WBC: 10.8 10*3/uL — AB (ref 4.0–10.5)

## 2014-11-03 LAB — COMPREHENSIVE METABOLIC PANEL
ALBUMIN: 4.6 g/dL (ref 3.5–5.2)
ALT: 17 U/L (ref 0–35)
AST: 13 U/L (ref 0–37)
Alkaline Phosphatase: 61 U/L (ref 39–117)
BUN: 7 mg/dL (ref 6–23)
CHLORIDE: 105 meq/L (ref 96–112)
CO2: 28 meq/L (ref 19–32)
CREATININE: 0.59 mg/dL (ref 0.40–1.20)
Calcium: 9.6 mg/dL (ref 8.4–10.5)
GFR: 124.45 mL/min (ref >60.00)
GLUCOSE: 94 mg/dL (ref 70–99)
Potassium: 3.7 mEq/L (ref 3.5–5.1)
SODIUM: 140 meq/L (ref 135–145)
Total Bilirubin: 0.4 mg/dL (ref 0.2–1.2)
Total Protein: 7.4 g/dL (ref 6.0–8.3)

## 2014-11-03 LAB — SEDIMENTATION RATE: SED RATE: 10 mm/h (ref 0–22)

## 2014-11-03 MED ORDER — DICYCLOMINE HCL 10 MG PO CAPS: 10.0000 mg | ORAL_CAPSULE | Freq: Three times a day (TID) | ORAL | Status: DC

## 2014-11-03 MED ORDER — AZITHROMYCIN 250 MG PO TABS
ORAL_TABLET | ORAL | Status: DC
Start: 2014-11-03 — End: 2015-01-26

## 2014-11-03 MED ORDER — MESALAMINE ER 0.375 G PO CP24: ORAL_CAPSULE | ORAL | Status: DC

## 2014-11-03 MED ORDER — PROMETHAZINE HCL 25 MG PO TABS: 12.5000 mg | ORAL_TABLET | Freq: Four times a day (QID) | ORAL | Status: DC | PRN

## 2014-11-03 NOTE — Progress Notes (Signed)
Jessica Cordova [birth year] [id number]  Note: This dictation was prepared with Dragon digital system. Any transcriptional errors that result from this procedure are unintentional.   History of Present Illness: This is a 34 year old white female with Crohn's colitis and ileitis diagnosed in 28. I have been following her for past 23 years. She was initially on Remicade from 2001 to /2003 .She didn't follow up for several years due to not having insurance. She has been back for  2 year. Last colonoscopy in June 2015 showed  colitis as well as ileitis. She has been on mesalamine, budesonide and dicyclomine. She started  Humira 40 mg every 2 weeks including induction regimen starting February 2016. Last TB test in January 2016. She has been significantly improved since she started Humira but is not completely well. She has missed several days of work because of  diarrhea / abdominal pain. Overall ,however ,she is better. She reports a skin reaction around the injection site lasting 48 hours  following the injection. She will be due for repeat TB test in January 2017    Past Medical History  Diagnosis Date  . Anxiety   . IBS (irritable bowel syndrome)   . Crohn's disease   . Migraines   . Renal colic   . Nephrolithiasis   . Pseudotumor cerebri   . Neutrophilic leukocytosis     Past Surgical History  Procedure Laterality Date  . Tonsillectomy    . Foot surgery      left x 2  . Leep    . Wisdom tooth extraction      No Known Allergies  Family history and social history have been reviewed.  Review of Systems: Occasional crampy abdominal pain. Diarrhea.  The remainder of the 10 point ROS is negative except as outlined in the H&P  Physical Exam: General Appearance Well developed, in no distress Eyes  Non icteric  HEENT  Non traumatic, normocephalic  Mouth No lesion, tongue papillated, no cheilosis Neck Supple without adenopathy, thyroid not enlarged, no carotid bruits, no  JVD Lungs Clear to auscultation bilaterally COR Normal S1, normal S2, regular rhythm, no murmur, quiet precordium Abdomen soft nontender abdomen with normoactive bowel sounds. Rectal not done Extremities  No pedal edema Skin No lesions Neurological Alert and oriented x 3 Psychological Normal mood and affect  Assessment and Plan:   34 year old white female with Crohn's colitis and ileitis improved on Humira 40 mg every 2 weeks. She ran out of mesalamine. Her insurance did not cover Lialda. We are switching her back to Apriso0.375 mg 2 twice a day. Refill dicyclomine and Phenergan. We will  be checking CBC, sedimentation rate, metabolic panel She will be followed by Dr.Nandigam     Jessica Cordova 11/03/2014

## 2014-11-03 NOTE — Patient Instructions (Addendum)
You will need to contact your PCP to schedule an appointment, Ask for Jessica Cordova Go to the basement for labs today We have sent in prescriptions to your pharmacy Dr Edward Jolly, DR Silverio Decamp

## 2014-11-23 ENCOUNTER — Telehealth: Payer: Self-pay | Admitting: *Deleted

## 2014-11-28 NOTE — Telephone Encounter (Signed)
Spoke with patient and scheduled OV with Dr. Silverio Decamp. On 01/26/15

## 2014-12-06 ENCOUNTER — Ambulatory Visit: Payer: 59 | Admitting: Internal Medicine

## 2014-12-26 ENCOUNTER — Other Ambulatory Visit: Payer: Self-pay

## 2014-12-26 ENCOUNTER — Telehealth: Payer: Self-pay | Admitting: Gastroenterology

## 2014-12-26 MED ORDER — ADALIMUMAB 40 MG/0.8ML ~~LOC~~ AJKT: 1.0000 "pen " | AUTO-INJECTOR | SUBCUTANEOUS | Status: DC

## 2014-12-26 NOTE — Telephone Encounter (Signed)
Ok to refill 

## 2014-12-26 NOTE — Telephone Encounter (Signed)
I it okay to fill her Humira under your name? She has an OV with you on 01/26/15 to establish her care.Thanks.

## 2014-12-26 NOTE — Telephone Encounter (Signed)
Spoke with the patient and confirmed

## 2015-01-26 ENCOUNTER — Encounter: Payer: Self-pay | Admitting: Gastroenterology

## 2015-01-26 ENCOUNTER — Other Ambulatory Visit: Payer: 59

## 2015-01-26 ENCOUNTER — Ambulatory Visit (INDEPENDENT_AMBULATORY_CARE_PROVIDER_SITE_OTHER): Payer: 59 | Admitting: Gastroenterology

## 2015-01-26 VITALS — BP 106/70 | HR 88 | Ht 61.5 in | Wt 130.1 lb

## 2015-01-26 DIAGNOSIS — K5090 Crohn's disease, unspecified, without complications: Secondary | ICD-10-CM

## 2015-01-26 MED ORDER — MESALAMINE ER 0.375 G PO CP24: ORAL_CAPSULE | ORAL | Status: DC

## 2015-01-26 MED ORDER — ADALIMUMAB 40 MG/0.8ML ~~LOC~~ AJKT: 1.0000 "pen " | AUTO-INJECTOR | SUBCUTANEOUS | Status: DC

## 2015-01-26 MED ORDER — DICYCLOMINE HCL 10 MG PO CAPS: 10.0000 mg | ORAL_CAPSULE | Freq: Three times a day (TID) | ORAL | Status: DC

## 2015-01-26 NOTE — Addendum Note (Signed)
Addended by: Oda Kilts on: 01/26/2015 09:15 AM   Modules accepted: Orders

## 2015-01-26 NOTE — Progress Notes (Signed)
Jessica Cordova    [id number]    [birth year]  Primary Care 25, MD  Referring Physician: Cher Nakai, MD [address], [place] [place], [place] [address]  Chief complaint:  Crohn's ileocolitis HPI:  34 year old white female with Crohn's colitis and ileitis diagnosed in 1992 currently in remission on Humira 40 mg every 2 weeks which was started in February 2016. She was initially on Remicade from 2001 to /2003 .She didn't follow up for several years due to not having insurance.  Last colonoscopy in June 2015 showed colitis as well as ileitis. Last TB test in January 2016. She has been significantly improved since she started Humira . She reports a skin reaction around the injection site lasting about 48-72 hours following the injection and completely resolves in 2-3 days. Denies any nausea, vomiting, abdominal pain, diarrhea, constipation, melena or bright red blood per rectum   Outpatient Encounter Prescriptions as of 01/26/2015  Medication Sig  . Adalimumab (HUMIRA PEN) 40 MG/0.8ML PNKT Inject 1 pen into the skin every 14 (fourteen) days.  Marland Kitchen dicyclomine (BENTYL) 10 MG capsule Take 1 capsule (10 mg total) by mouth 3 (three) times daily before meals.  . fluticasone (FLONASE) 50 MCG/ACT nasal spray Place 1 spray into both nostrils as needed for allergies or rhinitis.  Marland Kitchen mesalamine (APRISO) 0.375 G 24 hr capsule 2 by mouth twice a day  . NON FORMULARY Take 4 drops by mouth 2 (two) times daily. [facility] system  . promethazine (PHENERGAN) 25 MG tablet Take 0.5 tablets (12.5 mg total) by mouth every 6 (six) hours as needed for nausea or vomiting.  . [DISCONTINUED] azithromycin (ZITHROMAX) 250 MG tablet As directed   No facility-administered encounter medications on file as of 01/26/2015.    Allergies as of 01/26/2015  . (No Known Allergies)    Past Medical History  Diagnosis Date  . Anxiety   . IBS (irritable bowel syndrome)   . Crohn's  disease ([place])   . Migraines   . Renal colic   . Nephrolithiasis   . Pseudotumor cerebri   . Neutrophilic leukocytosis     Past Surgical History  Procedure Laterality Date  . Tonsillectomy    . Foot surgery      left x 2  . Leep    . Wisdom tooth extraction      Family History  Problem Relation Age of Onset  . Colon cancer Paternal Aunt   . Heart disease Maternal Grandfather   . Heart disease Paternal Grandfather   . Hypertension Father     Social History   Social History  . Marital Status: Single    Spouse Name: N/A  . Number of Children: 1  . Years of Education: N/A   Occupational History  .     Social History Main Topics  . Smoking status: Former Smoker    Quit date: 03/18/2007  . Smokeless tobacco: Never Used  . Alcohol Use: No     Comment: occasionally  . Drug Use: No  . Sexual Activity: Not on file   Other Topics Concern  . Not on file   Social History Narrative      Review of systems: Review of Systems  Constitutional: Negative for fever and chills.  HENT: Negative.   Eyes: Negative for blurred vision.  Respiratory: Negative for cough, shortness of breath and wheezing.   Cardiovascular: Negative for chest pain and palpitations.  Gastrointestinal: as per HPI  Genitourinary: Negative for dysuria, urgency, frequency and hematuria.  Musculoskeletal: Negative for myalgias, back pain and joint pain.  Skin: Negative for itching and rash.  Neurological: Negative for dizziness, tremors, focal weakness, seizures and loss of consciousness.  Endo/Heme/Allergies: Negative for environmental allergies.  Psychiatric/Behavioral: Negative for depression, suicidal ideas and hallucinations.  All other systems reviewed and are negative.   Physical Exam: Filed Vitals:   01/26/15 0842  BP: 106/70  Pulse: 88   Gen:      No acute distress HEENT:  EOMI, sclera anicteric Neck:     No masses; no thyromegaly Lungs:    Clear to auscultation bilaterally; normal  respiratory effort CV:         Regular rate and rhythm; no murmurs Abd:      + bowel sounds; soft, non-tender; no palpable masses, no distension Ext:    No edema; adequate peripheral perfusion Skin:      Warm and dry; no rash Neuro: alert and oriented x 3 Psych: normal mood and affect  Data Reviewed: Colonoscopy June 2015 Abnormal mucosa was found in the descending colon; multiple random biopsies of the area were performed using cold forceps ,findings are consistent with mild nonspecific colitis 1. Surgical [P], terminal ileum, biopsy - PATCHY CHRONIC MINIMALLY ACTIVE ILEITIS WITH SURFACE EROSION. - NO GRANULOMA OR DYSPLASIA. 2. Surgical [P], right colon, biopsy - BENIGN COLONIC MUCOSA WITH INTRAMUCOSAL LYMPHOID AGGREGATE. - NO EVIDENCE OF ACTIVE INFLAMMATION, GRANULOMA OR DYSPLASIA. 3. Surgical [P], descending, biopsy - PATCHY CHRONIC MILDLY ACTIVE COLITIS. - NO GRANULOMA OR DYSPLASIA. 4. Surgical [P], rectosigmoid, biopsy - BENIGN COLONIC MUCOSA WITH INTRAMUCOSAL LYMPHOID AGGREGATE AND PANETH CELL METAPLASIA. - NO EVIDENCE OF ACTIVE INFLAMMATION OR DYSPLASIA.  Assessment and Plan/Recommendations: 34 year old female with history of Crohn's ileocolitis diagnosed at age of 76, is here for follow-up visit. She is currently doing well with minimal symptoms on Humira 40 mg every 2 weeks. She is due for TB testing in January 2017, will obtain QuantiFERON-TB Gold during this visit today. Refill medications Humira and Apriso Due for recall colonoscopy in June 2017 for surveillance Return in 6 months   K. Denzil Magnuson , MD [phone] Mon-Fri 8a-5p [phone] after 5p, weekends, holidays

## 2015-01-26 NOTE — Addendum Note (Signed)
Addended by: Oda Kilts on: 01/26/2015 09:25 AM   Modules accepted: Orders

## 2015-01-26 NOTE — Patient Instructions (Addendum)
Go to the basement for labs today Recall colonoscopy has been changed to 08/2015 Follow up in 6 months We will refill your medications today

## 2015-03-23 ENCOUNTER — Telehealth: Payer: Self-pay | Admitting: *Deleted

## 2015-03-23 NOTE — Telephone Encounter (Signed)
Refilled Humira from fax request with two additional refills

## 2015-06-13 ENCOUNTER — Other Ambulatory Visit: Payer: Self-pay | Admitting: Obstetrics and Gynecology

## 2015-06-15 LAB — CYTOLOGY - PAP

## 2015-08-01 ENCOUNTER — Encounter: Payer: Self-pay | Admitting: Gastroenterology

## 2015-11-29 DIAGNOSIS — F419 Anxiety disorder, unspecified: Secondary | ICD-10-CM | POA: Diagnosis not present

## 2015-11-29 DIAGNOSIS — K5190 Ulcerative colitis, unspecified, without complications: Secondary | ICD-10-CM | POA: Diagnosis not present

## 2015-11-29 DIAGNOSIS — J0100 Acute maxillary sinusitis, unspecified: Secondary | ICD-10-CM | POA: Diagnosis not present

## 2015-11-29 DIAGNOSIS — J3089 Other allergic rhinitis: Secondary | ICD-10-CM | POA: Diagnosis not present

## 2016-01-22 DIAGNOSIS — J3089 Other allergic rhinitis: Secondary | ICD-10-CM | POA: Diagnosis not present

## 2016-01-22 DIAGNOSIS — K5190 Ulcerative colitis, unspecified, without complications: Secondary | ICD-10-CM | POA: Diagnosis not present

## 2016-01-22 DIAGNOSIS — R42 Dizziness and giddiness: Secondary | ICD-10-CM | POA: Diagnosis not present

## 2016-01-22 DIAGNOSIS — J0100 Acute maxillary sinusitis, unspecified: Secondary | ICD-10-CM | POA: Diagnosis not present

## 2016-02-19 DIAGNOSIS — K5090 Crohn's disease, unspecified, without complications: Secondary | ICD-10-CM | POA: Diagnosis not present

## 2016-02-19 DIAGNOSIS — R5383 Other fatigue: Secondary | ICD-10-CM | POA: Diagnosis not present

## 2016-02-19 DIAGNOSIS — J3089 Other allergic rhinitis: Secondary | ICD-10-CM | POA: Diagnosis not present

## 2016-02-19 DIAGNOSIS — K5190 Ulcerative colitis, unspecified, without complications: Secondary | ICD-10-CM | POA: Diagnosis not present

## 2016-06-26 ENCOUNTER — Encounter (INDEPENDENT_AMBULATORY_CARE_PROVIDER_SITE_OTHER): Payer: Self-pay

## 2016-06-26 ENCOUNTER — Encounter: Payer: Self-pay | Admitting: Gastroenterology

## 2016-06-26 ENCOUNTER — Ambulatory Visit (INDEPENDENT_AMBULATORY_CARE_PROVIDER_SITE_OTHER): Payer: BLUE CROSS/BLUE SHIELD | Admitting: Gastroenterology

## 2016-06-26 ENCOUNTER — Other Ambulatory Visit (INDEPENDENT_AMBULATORY_CARE_PROVIDER_SITE_OTHER): Payer: BLUE CROSS/BLUE SHIELD

## 2016-06-26 VITALS — BP 102/60 | HR 64 | Ht 61.0 in | Wt 143.0 lb

## 2016-06-26 DIAGNOSIS — K626 Ulcer of anus and rectum: Secondary | ICD-10-CM

## 2016-06-26 DIAGNOSIS — K50911 Crohn's disease, unspecified, with rectal bleeding: Secondary | ICD-10-CM

## 2016-06-26 DIAGNOSIS — R198 Other specified symptoms and signs involving the digestive system and abdomen: Secondary | ICD-10-CM | POA: Diagnosis not present

## 2016-06-26 DIAGNOSIS — R197 Diarrhea, unspecified: Secondary | ICD-10-CM

## 2016-06-26 DIAGNOSIS — R1013 Epigastric pain: Secondary | ICD-10-CM

## 2016-06-26 DIAGNOSIS — K625 Hemorrhage of anus and rectum: Secondary | ICD-10-CM

## 2016-06-26 LAB — FERRITIN: FERRITIN: 41.8 ng/mL (ref 10.0–291.0)

## 2016-06-26 LAB — CBC WITH DIFFERENTIAL/PLATELET
BASOS ABS: 0.1 10*3/uL (ref 0.0–0.1)
Basophils Relative: 0.5 % (ref 0.0–3.0)
EOS ABS: 0.1 10*3/uL (ref 0.0–0.7)
Eosinophils Relative: 1 % (ref 0.0–5.0)
HCT: 44.3 % (ref 36.0–46.0)
HEMOGLOBIN: 14.9 g/dL (ref 12.0–15.0)
Lymphocytes Relative: 32.6 % (ref 12.0–46.0)
Lymphs Abs: 3.6 10*3/uL (ref 0.7–4.0)
MCHC: 33.6 g/dL (ref 30.0–36.0)
MCV: 89.1 fl (ref 78.0–100.0)
MONO ABS: 1 10*3/uL (ref 0.1–1.0)
Monocytes Relative: 8.9 % (ref 3.0–12.0)
Neutro Abs: 6.3 10*3/uL (ref 1.4–7.7)
Neutrophils Relative %: 57 % (ref 43.0–77.0)
Platelets: 588 10*3/uL — ABNORMAL HIGH (ref 150.0–400.0)
RBC: 4.97 Mil/uL (ref 3.87–5.11)
RDW: 14.1 % (ref 11.5–15.5)
WBC: 11.1 10*3/uL — AB (ref 4.0–10.5)

## 2016-06-26 LAB — IBC PANEL
Iron: 126 ug/dL (ref 42–145)
Saturation Ratios: 34 % (ref 20.0–50.0)
Transferrin: 265 mg/dL (ref 212.0–360.0)

## 2016-06-26 LAB — COMPREHENSIVE METABOLIC PANEL
ALBUMIN: 4.5 g/dL (ref 3.5–5.2)
ALT: 22 U/L (ref 0–35)
AST: 20 U/L (ref 0–37)
Alkaline Phosphatase: 54 U/L (ref 39–117)
BUN: 7 mg/dL (ref 6–23)
CHLORIDE: 107 meq/L (ref 96–112)
CO2: 26 meq/L (ref 19–32)
CREATININE: 0.56 mg/dL (ref 0.40–1.20)
Calcium: 9.2 mg/dL (ref 8.4–10.5)
GFR: 130.9 mL/min (ref >60.00)
GLUCOSE: 86 mg/dL (ref 70–99)
Potassium: 3.6 mEq/L (ref 3.5–5.1)
SODIUM: 139 meq/L (ref 135–145)
Total Bilirubin: 0.6 mg/dL (ref 0.2–1.2)
Total Protein: 7.3 g/dL (ref 6.0–8.3)

## 2016-06-26 LAB — HIGH SENSITIVITY CRP: CRP, High Sensitivity: 0.44 mg/L (ref 0.000–5.000)

## 2016-06-26 LAB — SEDIMENTATION RATE: SED RATE: 16 mm/h (ref 0–20)

## 2016-06-26 LAB — FOLATE: Folate: 18.2 ng/mL (ref >5.9)

## 2016-06-26 LAB — VITAMIN B12: VITAMIN B 12: 409 pg/mL (ref 211–911)

## 2016-06-26 MED ORDER — PROMETHAZINE HCL 25 MG PO TABS: 12.5000 mg | ORAL_TABLET | Freq: Four times a day (QID) | ORAL | 2 refills | Status: AC | PRN

## 2016-06-26 MED ORDER — MESALAMINE ER 0.375 G PO CP24: 375.0000 mg | ORAL_CAPSULE | Freq: Every day | ORAL | 6 refills | Status: DC

## 2016-06-26 MED ORDER — PANTOPRAZOLE SODIUM 40 MG PO TBEC: 40.0000 mg | DELAYED_RELEASE_TABLET | Freq: Every day | ORAL | 3 refills | Status: DC

## 2016-06-26 MED ORDER — DICYCLOMINE HCL 10 MG PO CAPS: 10.0000 mg | ORAL_CAPSULE | Freq: Three times a day (TID) | ORAL | 2 refills | Status: DC

## 2016-06-26 MED ORDER — NA SULFATE-K SULFATE-MG SULF 17.5-3.13-1.6 GM/177ML PO SOLN: 1.0000 | Freq: Once | ORAL | 0 refills | Status: AC

## 2016-06-26 NOTE — Progress Notes (Addendum)
Jessica Cordova    [id number]    January 05, 1981  Primary Care 60, MD  Referring Physician: Cher Nakai, MD [address] [place], [place] [address]  Chief complaint:  Crohn's disease flare  HPI:  36 year old female with history of Crohn's ileal colonic involvement, diagnosed in 1992 was in remission on Humira 40 mg every 2 weeks but she stopped taking it slightly over a year ago because her insurance had changed with increased out-of-pocket expense. She was last seen in office in November 2016. Patient did not follow-up since then and did not contact our office. She has been having increased episodes of diarrhea alternating with constipation. She also has been noticing intermittent blood per rectum. She is having epigastric abdominal pain with nausea and fullness. Denies any fever or chills. She has gained weight in the recent past. She denies any discharge per rectum or rectal tenderness. No skin rash or joint pain She was having some irritation at the site of Humira injection which would last one to 2 days and would prefer not to go back on it. She was on Remicade about 15 years ago in around 2001, does not recall having any allergic reaction to Remicade and she does not recall why she switched to Humira. She is currently not on any medication. Patient does not want to take prednisone and she has had osteopenia with bilateral ankle fracture and multiple complications being on long-term prednisone in the past    Outpatient Encounter Prescriptions as of 06/26/2016  Medication Sig  . fluticasone (FLONASE) 50 MCG/ACT nasal spray Place 1 spray into both nostrils as needed for allergies or rhinitis.  . promethazine (PHENERGAN) 25 MG tablet Take 0.5 tablets (12.5 mg total) by mouth every 6 (six) hours as needed for nausea or vomiting.  . [DISCONTINUED] Adalimumab (HUMIRA PEN) 40 MG/0.8ML PNKT Inject 1 pen into the skin every 14 (fourteen) days.  .  [DISCONTINUED] dicyclomine (BENTYL) 10 MG capsule Take 1 capsule (10 mg total) by mouth 3 (three) times daily before meals.  . [DISCONTINUED] mesalamine (APRISO) 0.375 G 24 hr capsule 2 by mouth twice a day  . [DISCONTINUED] NON FORMULARY Take 4 drops by mouth 2 (two) times daily. [employer] system   No facility-administered encounter medications on file as of 06/26/2016.     Allergies as of 06/26/2016  . (No Known Allergies)    Past Medical History:  Diagnosis Date  . Anxiety   . Crohn's disease ([place])   . IBS (irritable bowel syndrome)   . Migraines   . Nephrolithiasis   . Neutrophilic leukocytosis   . Pseudotumor cerebri   . Renal colic     Past Surgical History:  Procedure Laterality Date  . FOOT SURGERY     left x 2  . LEEP    . TONSILLECTOMY    . WISDOM TOOTH EXTRACTION      Family History  Problem Relation Age of Onset  . Colon cancer Paternal Aunt   . Heart disease Maternal Grandfather   . Heart disease Paternal Grandfather   . Hypertension Father     Social History   Social History  . Marital status: Single    Spouse name: N/A  . Number of children: 1  . Years of education: N/A   Occupational History  .  [employer]   Social History Main Topics  . Smoking status: Former Smoker    Quit date: 03/18/2007  .  Smokeless tobacco: Never Used  . Alcohol use No     Comment: occasionally  . Drug use: No  . Sexual activity: Not on file   Other Topics Concern  . Not on file   Social History Narrative  . No narrative on file      Review of systems: Review of Systems  Constitutional: Negative for fever and chills.  HENT: Negative.   Eyes: Negative for blurred vision.  Respiratory: Negative for cough, shortness of breath and wheezing.   Cardiovascular: Negative for chest pain and palpitations.  Gastrointestinal: as per HPI Genitourinary: Negative for dysuria, urgency, frequency and hematuria.  Musculoskeletal: Negative for myalgias,  back pain and joint pain.  Skin: Negative for itching and rash.  Neurological: Negative for dizziness, tremors, focal weakness, seizures and loss of consciousness.  Endo/Heme/Allergies: Positive for seasonal allergies.  Psychiatric/Behavioral: Negative for depression, suicidal ideas and hallucinations.  All other systems reviewed and are negative.   Physical Exam: Vitals:   06/26/16 0859  BP: 102/60  Pulse: 64   Body mass index is 27.02 kg/m. Gen:      No acute distress HEENT:  EOMI, sclera anicteric Neck:     No masses; no thyromegaly Lungs:    Clear to auscultation bilaterally; normal respiratory effort CV:         Regular rate and rhythm; no murmurs Abd:      + bowel sounds; soft, non-tender; no palpable masses, no distension Ext:    No edema; adequate peripheral perfusion Skin:      Warm and dry; no rash Neuro: alert and oriented x 3 Psych: normal mood and affect Rectal exam: Normal anal sphincter tone, no anal fissure or external hemorrhoids Anoscopy: Small internal hemorrhoids, no active bleeding, normal dentate line, no visible nodules, mucus with cratered ulcer in the distal rectal  Data Reviewed:  Reviewed labs, radiology imaging, old records and pertinent past GI work up  Last TB test in January 2016 with negative TB QuantiFERON  Colonoscopy June 2015 by Dr. Olevia Perches showed decreased vascularity in the colon with patchy erythema otherwise no ulceration. Terminal ileum appeared normal. Random biopsies were obtained, pathology report showed 1. Surgical [P], terminal ileum, biopsy - PATCHY CHRONIC MINIMALLY ACTIVE ILEITIS WITH SURFACE EROSION. - NO GRANULOMA OR DYSPLASIA. 2. Surgical [P], right colon, biopsy - BENIGN COLONIC MUCOSA WITH INTRAMUCOSAL LYMPHOID AGGREGATE. - NO EVIDENCE OF ACTIVE INFLAMMATION, GRANULOMA OR DYSPLASIA. 3. Surgical [P], descending, biopsy - PATCHY CHRONIC MILDLY ACTIVE COLITIS. - NO GRANULOMA OR DYSPLASIA. 4. Surgical [P], rectosigmoid,  biopsy - BENIGN COLONIC MUCOSA WITH INTRAMUCOSAL LYMPHOID AGGREGATE AND PANETH CELL METAPLASIA. - NO EVIDENCE OF ACTIVE INFLAMMATION OR DYSPLASIA.  Assessment and Plan/Recommendations:  36 year old female with history of Crohn's disease with ileal and colonic involvement here with complaints of intermittent diarrhea, abdominal discomfort, fullness, nausea and rectal bleeding for past few weeks concerning for Crohn's flare She has not been on any medication for maintenance of Crohn's disease for over a year  We'll check GI pathogen panel to exclude C. difficile colitis exacerbating Crohn's disease Check CBC, CMP, CRP and TB QuantiFERON  Discussed in detail with patient the need to be compliant with medication and follow-up to prevent recurrent flares of disease and to maintain remission  Evidence of rectal ulcer on anoscopy She needs colonoscopy to evaluate the disease activity and extent of disease She has upper GI symptoms with epigastric abdominal pain, nausea and postprandial fullness concerning for possible upper GI tract involvement with Crohn's disease We'll schedule  for EGD along with colonoscopy The risks and benefits as well as alternatives of endoscopic procedure(s) have been discussed and reviewed. All questions answered. The patient agrees to proceed.  We will try to get approval from insurance to initiate Remicade infusion, will need induction 5 mg/kg at 0, 2, 6 weeks followed by maintenance dose every 8 weeks  Start Protonix 40 mg daily, 30 minutes before breakfast for epigastric pain Apriso 0.375 mg daily for Crohn's colitis Probiotic VSL 3 900 billion units daily  Okay to use Bentyl and Phenergan as needed  Return in 1 month or sooner if needed  Damaris Hippo , MD [phone] Mon-Fri 8a-5p [phone] after 5p, weekends, holidays  CC: Cher Nakai, MD

## 2016-06-26 NOTE — Patient Instructions (Addendum)
Go to the basement for labs today  We have sent Apriso and Protonix to your pharmacy  We refilled your Bentyl and phenergan for you today  We will try and start approval for Remicade treatments  We have given you VSL #3 900 BU to take one daily   You have been scheduled for an endoscopy and colonoscopy. Please follow the written instructions given to you at your visit today. Please pick up your prep supplies at the pharmacy within the next 1-3 days. If you use inhalers (even only as needed), please bring them with you on the day of your procedure. Your physician has requested that you go to www.startemmi.com and enter the access code given to you at your visit today. This web site gives a general overview about your procedure. However, you should still follow specific instructions given to you by our office regarding your preparation for the procedure.

## 2016-06-27 ENCOUNTER — Telehealth: Payer: Self-pay | Admitting: Gastroenterology

## 2016-06-27 ENCOUNTER — Other Ambulatory Visit: Payer: BLUE CROSS/BLUE SHIELD

## 2016-06-27 ENCOUNTER — Other Ambulatory Visit: Payer: Self-pay

## 2016-06-27 DIAGNOSIS — K50911 Crohn's disease, unspecified, with rectal bleeding: Secondary | ICD-10-CM

## 2016-06-27 DIAGNOSIS — K50119 Crohn's disease of large intestine with unspecified complications: Secondary | ICD-10-CM

## 2016-06-27 LAB — QUANTIFERON TB GOLD ASSAY (BLOOD)
Interferon Gamma Release Assay: NEGATIVE
MITOGEN-NIL SO: 6.7 [IU]/mL
Quantiferon Nil Value: 0.02 IU/mL
Quantiferon Tb Ag Minus Nil Value: <0 IU/mL

## 2016-06-27 MED ORDER — ACETAMINOPHEN 325 MG PO TABS: 650.0000 mg | ORAL_TABLET | Freq: Once | ORAL | Status: AC

## 2016-06-27 MED ORDER — DIPHENHYDRAMINE HCL 25 MG PO CAPS: 50.0000 mg | ORAL_CAPSULE | Freq: Once | ORAL | Status: AC

## 2016-06-27 MED ORDER — SODIUM CHLORIDE 0.9 % IV SOLN: 5.0000 mg/kg | Freq: Once | INTRAVENOUS | Status: AC

## 2016-06-27 NOTE — Telephone Encounter (Signed)
I spoke with the patient. She is referring to the Remicade. I will mail her the rebate information. She must activate the card herself. I have explained this to the patient.

## 2016-07-04 ENCOUNTER — Telehealth: Payer: Self-pay | Admitting: Gastroenterology

## 2016-07-07 LAB — GASTROINTESTINAL PATHOGEN PANEL PCR
C. difficile Tox A/B, PCR: NOT DETECTED
CAMPYLOBACTER, PCR: NOT DETECTED
Cryptosporidium, PCR: NOT DETECTED
E coli (ETEC) LT/ST PCR: NOT DETECTED
E coli (STEC) stx1/stx2, PCR: NOT DETECTED
E coli 0157, PCR: NOT DETECTED
Giardia lamblia, PCR: NOT DETECTED
Norovirus, PCR: NOT DETECTED
ROTAVIRUS, PCR: NOT DETECTED
SALMONELLA, PCR: NOT DETECTED
SHIGELLA, PCR: NOT DETECTED

## 2016-07-07 NOTE — Telephone Encounter (Signed)
Left message to call back  

## 2016-07-08 NOTE — Telephone Encounter (Signed)
Advised of her stool study results. She is having a lot of bowel movements. Taking Bentyl and Apriso as ordered. Declines prednisone. Staying hydrated.

## 2016-07-10 ENCOUNTER — Ambulatory Visit (HOSPITAL_COMMUNITY): Admission: RE | Admit: 2016-07-10 | Payer: BLUE CROSS/BLUE SHIELD | Source: Ambulatory Visit

## 2016-07-10 ENCOUNTER — Encounter: Payer: Self-pay | Admitting: Gastroenterology

## 2016-07-14 ENCOUNTER — Telehealth: Payer: Self-pay | Admitting: Gastroenterology

## 2016-07-14 NOTE — Telephone Encounter (Signed)
Called [facility] and gave them information on Suprep coupon the pts cost is $50. Called pt and informed. She said she really couldn't afford the $50 so I explained to her that she could use Miralax prep and purchase everything over the counter but that is not our preferred prep, She stated she would work it out and get the [employer]

## 2016-07-21 ENCOUNTER — Ambulatory Visit (AMBULATORY_SURGERY_CENTER): Payer: BLUE CROSS/BLUE SHIELD | Admitting: Gastroenterology

## 2016-07-21 ENCOUNTER — Encounter: Payer: Self-pay | Admitting: Gastroenterology

## 2016-07-21 VITALS — BP 100/71 | HR 89 | Temp 98.6°F | Resp 14 | Ht 61.0 in | Wt 143.0 lb

## 2016-07-21 DIAGNOSIS — K625 Hemorrhage of anus and rectum: Secondary | ICD-10-CM

## 2016-07-21 DIAGNOSIS — K2990 Gastroduodenitis, unspecified, without bleeding: Secondary | ICD-10-CM | POA: Diagnosis not present

## 2016-07-21 DIAGNOSIS — K2950 Unspecified chronic gastritis without bleeding: Secondary | ICD-10-CM | POA: Diagnosis not present

## 2016-07-21 DIAGNOSIS — K5090 Crohn's disease, unspecified, without complications: Secondary | ICD-10-CM | POA: Diagnosis not present

## 2016-07-21 DIAGNOSIS — K2970 Gastritis, unspecified, without bleeding: Secondary | ICD-10-CM | POA: Diagnosis not present

## 2016-07-21 DIAGNOSIS — K5150 Left sided colitis without complications: Secondary | ICD-10-CM | POA: Diagnosis not present

## 2016-07-21 DIAGNOSIS — K50911 Crohn's disease, unspecified, with rectal bleeding: Secondary | ICD-10-CM

## 2016-07-21 MED ORDER — SODIUM CHLORIDE 0.9 % IV SOLN: 500.0000 mL | INTRAVENOUS | Status: AC

## 2016-07-21 NOTE — Patient Instructions (Addendum)
YOU HAD AN ENDOSCOPIC PROCEDURE TODAY AT [facility]:   Refer to the procedure report that was given to you for any specific questions about what was found during the examination.  If the procedure report does not answer your questions, please call your gastroenterologist to clarify.  If you requested that your care partner not be given the details of your procedure findings, then the procedure report has been included in a sealed envelope for you to review at your convenience later.  YOU SHOULD EXPECT: Some feelings of bloating in the abdomen. Passage of more gas than usual.  Walking can help get rid of the air that was put into your GI tract during the procedure and reduce the bloating. If you had a lower endoscopy (such as a colonoscopy or flexible sigmoidoscopy) you may notice spotting of blood in your stool or on the toilet paper. If you underwent a bowel prep for your procedure, you may not have a normal bowel movement for a few days.  Please Note:  You might notice some irritation and congestion in your nose or some drainage.  This is from the oxygen used during your procedure.  There is no need for concern and it should clear up in a day or so.  SYMPTOMS TO REPORT IMMEDIATELY:   Following lower endoscopy (colonoscopy or flexible sigmoidoscopy):  Excessive amounts of blood in the stool  Significant tenderness or worsening of abdominal pains  Swelling of the abdomen that is new, acute  Fever of 100F or higher   Following upper endoscopy (EGD)  Vomiting of blood or coffee ground material  New chest pain or pain under the shoulder blades  Painful or persistently difficult swallowing  New shortness of breath  Fever of 100F or higher  Black, tarry-looking stools  For urgent or emergent issues, a gastroenterologist can be reached at any hour by calling [phone].   DIET:  We do recommend a small meal at first, but then you may proceed to your regular diet.  Drink  plenty of fluids but you should avoid alcoholic beverages for 24 hours.  MEDICATIONS:  Continue present medications, including Apriso. No Aspirin, Naproxen, or other non-steroidal anti-inflammatory drugs.   Please see handouts given to you by your recovery nurse.  ACTIVITY:  You should plan to take it easy for the rest of today and you should NOT DRIVE or use heavy machinery until tomorrow (because of the sedation medicines used during the test).    FOLLOW UP:  Schedule CT enterography to evaluate possible mid small bowel involvement with Crohn's disease given significant abdominal pain. Dr. Woodward Ku nurse, Eustaquio Maize, will call you to schedule an appointment.  Return to GI clinic at the next available appointment.   Our staff will call the number listed on your records the next business day following your procedure to check on you and address any questions or concerns that you may have regarding the information given to you following your procedure. If we do not reach you, we will leave a message.  However, if you are feeling well and you are not experiencing any problems, there is no need to return our call.  We will assume that you have returned to your regular daily activities without incident.  If any biopsies were taken you will be contacted by phone or by letter within the next 1-3 weeks.  Please call us at [phone] if you have not heard about the biopsies in 3 weeks.   Thank  you for allowing [place] to provide for your healthcare needs today.  SIGNATURES/CONFIDENTIALITY: You and/or your care partner have signed paperwork which will be entered into your electronic medical record.  These signatures attest to the fact that that the information above on your After Visit Summary has been reviewed and is understood.  Full responsibility of the confidentiality of this discharge information lies with you and/or your care-partner.

## 2016-07-21 NOTE — Op Note (Signed)
[facility] Patient Name: Jessica Cordova Procedure Date: 07/21/2016 2:15 PM MRN: [id number] Endoscopist: Mauri Pole , MD Age: 36 Referring MD:  Date of Birth: 12-24-1980 Gender: Female Account #: [id number] Procedure:                Upper GI endoscopy Indications:              Esophageal reflux symptoms that recur despite                            appropriate therapy, Upper abdominal symptoms that                            persist despite an appropriate trial of therapy,                            Epigastric abdominal pain, Abdominal bloating Medicines:                Monitored Anesthesia Care Procedure:                Pre-Anesthesia Assessment:                           - Prior to the procedure, a History and Physical                            was performed, and patient medications and                            allergies were reviewed. The patient's tolerance of                            previous anesthesia was also reviewed. The risks                            and benefits of the procedure and the sedation                            options and risks were discussed with the patient.                            All questions were answered, and informed consent                            was obtained. Prior Anticoagulants: The patient has                            taken no previous anticoagulant or antiplatelet                            agents. ASA Grade Assessment: II - A patient with                            mild systemic disease. After reviewing the risks  and benefits, the patient was deemed in                            satisfactory condition to undergo the procedure.                           After obtaining informed consent, the endoscope was                            passed under direct vision. Throughout the                            procedure, the patient's blood pressure, pulse, and                            oxygen  saturations were monitored continuously. The                            Endoscope was introduced through the mouth, and                            advanced to the second part of duodenum. The upper                            GI endoscopy was accomplished without difficulty.                            The patient tolerated the procedure well. Scope In: Scope Out: Findings:                 The esophagus was normal.                           A small hiatal hernia was present.                           The cardia and gastric fundus were normal on                            retroflexion.                           The examined duodenum was normal. Complications:            No immediate complications. Estimated Blood Loss:     Estimated blood loss: none. Impression:               - Normal esophagus.                           - Small hiatal hernia.                           - Normal examined duodenum.                           - No specimens collected. Recommendation:           -  Patient has a contact number available for                            emergencies. The signs and symptoms of potential                            delayed complications were discussed with the                            patient. Return to normal activities tomorrow.                            Written discharge instructions were provided to the                            patient.                           - Resume previous diet.                           - Continue present medications.                           - No aspirin, ibuprofen, naproxen, or other                            non-steroidal anti-inflammatory drugs. Mauri Pole, MD 07/21/2016 3:16:58 PM This report has been signed electronically.

## 2016-07-21 NOTE — Progress Notes (Signed)
Called to room to assist during endoscopic procedure.  Patient ID and intended procedure confirmed with present staff. Received instructions for my participation in the procedure from the performing physician.  

## 2016-07-21 NOTE — Op Note (Signed)
[facility] Patient Name: Jessica Cordova Procedure Date: 07/21/2016 2:15 PM MRN: [id number] Endoscopist: Mauri Pole , MD Age: 36 Referring MD:  Date of Birth: [birth year] Gender: Female Account #: [id number] Procedure:                Colonoscopy Indications:              Evaluation of unexplained GI bleeding Medicines:                Monitored Anesthesia Care Procedure:                Pre-Anesthesia Assessment:                           - Prior to the procedure, a History and Physical                            was performed, and patient medications and                            allergies were reviewed. The patient's tolerance of                            previous anesthesia was also reviewed. The risks                            and benefits of the procedure and the sedation                            options and risks were discussed with the patient.                            All questions were answered, and informed consent                            was obtained. Prior Anticoagulants: The patient has                            taken no previous anticoagulant or antiplatelet                            agents. ASA Grade Assessment: II - A patient with                            mild systemic disease. After reviewing the risks                            and benefits, the patient was deemed in                            satisfactory condition to undergo the procedure.                           After obtaining informed consent, the colonoscope  was passed under direct vision. Throughout the                            procedure, the patient's blood pressure, pulse, and                            oxygen saturations were monitored continuously. The                            Colonoscope was introduced through the anus and                            advanced to the the terminal ileum, with                            identification of the  appendiceal orifice and IC                            valve. The colonoscopy was performed without                            difficulty. The patient tolerated the procedure                            well. The quality of the bowel preparation was                            excellent. The terminal ileum, ileocecal valve,                            appendiceal orifice, and rectum were photographed. Scope In: 2:49:57 PM Scope Out: 3:05:23 PM Scope Withdrawal Time: 0 hours 12 minutes 14 seconds  Total Procedure Duration: 0 hours 15 minutes 26 seconds  Findings:                 The perianal and digital rectal examinations were                            normal.                           The Simple Endoscopic Score for Crohn's Disease was                            determined based on the endoscopic appearance of                            the mucosa in the following segments:                           - Ileum: Findings include no ulcers present, no                            ulcerated surfaces, no affected surfaces and no  narrowings.                           - Right Colon: Findings include no ulcers present,                            no ulcerated surfaces, no affected surfaces and no                            narrowings.                           - Transverse Colon: Findings include no ulcers                            present, no ulcerated surfaces, no affected                            surfaces and no narrowings.                           - Left Colon: Findings include no ulcers present,                            no ulcerated surfaces, no affected surfaces and no                            narrowings.                           - Rectum: Findings include no ulcers present, no                            ulcerated surfaces, no affected surfaces and no                            narrowings.                           - Total SES-CD scoring: total aggregate score  was                            0. Biopsies were taken with a cold forceps for                            histology from terminal ileum, right colon, left                            colon and rectum.                           Non-bleeding internal hemorrhoids were found during                            retroflexion. The hemorrhoids were small.  The exam was otherwise without abnormality. Complications:            No immediate complications. Estimated Blood Loss:     Estimated blood loss was minimal. Impression:               - Simple Endoscopic Score for Crohn's Disease:                            total aggregate score was 0, mucosal inflammatory                            changes. Biopsied.                           - Non-bleeding internal hemorrhoids.                           - The examination was otherwise normal. Recommendation:           - Patient has a contact number available for                            emergencies. The signs and symptoms of potential                            delayed complications were discussed with the                            patient. Return to normal activities tomorrow.                            Written discharge instructions were provided to the                            patient.                           - Resume previous diet.                           - Continue present medications.                           - Await pathology results.                           - No ibuprofen, naproxen, or other non-steroidal                            anti-inflammatory drugs.                           - CT enterography to evaluate possible mid small                            bowel involement with Crohn's disease given  significant abdominal pain                           - Continue Apriso                           - Repeat colonoscopy in 2 years for surveillance.                           - Return to GI clinic  at the next available                            appointment. Mauri Pole, MD 07/21/2016 3:23:00 PM This report has been signed electronically.

## 2016-07-21 NOTE — Progress Notes (Signed)
Pt's states no medical or surgical changes since previsit or office visit. 

## 2016-07-21 NOTE — Progress Notes (Signed)
Report to PACU, RN, vss, BBS= Clear.  

## 2016-07-22 ENCOUNTER — Telehealth: Payer: Self-pay | Admitting: *Deleted

## 2016-07-22 ENCOUNTER — Other Ambulatory Visit: Payer: Self-pay

## 2016-07-22 DIAGNOSIS — K50119 Crohn's disease of large intestine with unspecified complications: Secondary | ICD-10-CM

## 2016-07-22 DIAGNOSIS — R1030 Lower abdominal pain, unspecified: Secondary | ICD-10-CM

## 2016-07-22 NOTE — Telephone Encounter (Signed)
No answer, left message to call if questions or concerns. 

## 2016-07-22 NOTE — Telephone Encounter (Signed)
No answer left message to call if questions or concerns.

## 2016-07-22 NOTE — Telephone Encounter (Signed)
  Follow up Call-  Call back number 07/21/2016  Post procedure Call Back phone  # [phone]  Permission to leave phone message Yes  Some recent data might be hidden     Patient questions:  Do you have a fever, pain , or abdominal swelling? No. Pain Score  0 *  Have you tolerated food without any problems? Yes.    Have you been able to return to your normal activities? Yes.    Do you have any questions about your discharge instructions: Diet   No. Medications  No. Follow up visit  No.  Do you have questions or concerns about your Care? No.  Actions: * If pain score is 4 or above: No action needed, pain <4.

## 2016-07-24 ENCOUNTER — Inpatient Hospital Stay: Admission: RE | Admit: 2016-07-24 | Payer: BLUE CROSS/BLUE SHIELD | Source: Ambulatory Visit

## 2016-07-24 ENCOUNTER — Ambulatory Visit (HOSPITAL_COMMUNITY): Payer: BLUE CROSS/BLUE SHIELD

## 2016-07-25 ENCOUNTER — Telehealth: Payer: Self-pay | Admitting: Gastroenterology

## 2016-07-25 NOTE — Telephone Encounter (Signed)
Patient was called by a 3rd party hired by [employer] to [employer]. This group indicated [place] [facility] was out of network for her. I called [employer]. They confirmed [place] [facility] is in-network. I called the group that contacted the patient ([place] [phone]). They explained they have a group of "shoppers" that search for the cheapest place to get radiology procedures done. They deny telly her [place] [facility] is out of network. Shared this information with the patient. She declines to move her CT from [place].

## 2016-07-28 ENCOUNTER — Encounter: Payer: BLUE CROSS/BLUE SHIELD | Admitting: Gastroenterology

## 2016-07-28 DIAGNOSIS — J0100 Acute maxillary sinusitis, unspecified: Secondary | ICD-10-CM | POA: Diagnosis not present

## 2016-07-28 DIAGNOSIS — J3089 Other allergic rhinitis: Secondary | ICD-10-CM | POA: Diagnosis not present

## 2016-07-28 DIAGNOSIS — K5190 Ulcerative colitis, unspecified, without complications: Secondary | ICD-10-CM | POA: Diagnosis not present

## 2016-07-28 DIAGNOSIS — K5090 Crohn's disease, unspecified, without complications: Secondary | ICD-10-CM | POA: Diagnosis not present

## 2016-07-28 DIAGNOSIS — E663 Overweight: Secondary | ICD-10-CM | POA: Diagnosis not present

## 2016-08-01 ENCOUNTER — Ambulatory Visit (INDEPENDENT_AMBULATORY_CARE_PROVIDER_SITE_OTHER)
Admission: RE | Admit: 2016-08-01 | Discharge: 2016-08-01 | Disposition: A | Discharge status: Home / Self Care | Payer: BLUE CROSS/BLUE SHIELD | Source: Ambulatory Visit | Attending: Gastroenterology | Admitting: Gastroenterology

## 2016-08-01 DIAGNOSIS — K50119 Crohn's disease of large intestine with unspecified complications: Secondary | ICD-10-CM

## 2016-08-01 DIAGNOSIS — R1030 Lower abdominal pain, unspecified: Secondary | ICD-10-CM

## 2016-08-01 DIAGNOSIS — R197 Diarrhea, unspecified: Secondary | ICD-10-CM | POA: Diagnosis not present

## 2016-08-01 DIAGNOSIS — R109 Unspecified abdominal pain: Secondary | ICD-10-CM | POA: Diagnosis not present

## 2016-08-01 MED ORDER — IOPAMIDOL (ISOVUE-300) INJECTION 61%
100.0000 mL | Freq: Once | INTRAVENOUS | Status: AC | PRN
  Administered 2016-08-01: 100 mL via INTRAVENOUS

## 2016-08-04 ENCOUNTER — Other Ambulatory Visit: Payer: Self-pay

## 2016-08-04 ENCOUNTER — Telehealth: Payer: Self-pay | Admitting: Gastroenterology

## 2016-08-04 MED ORDER — VSL#3 PO PACK: 1.0000 | PACK | Freq: Two times a day (BID) | ORAL | 1 refills | Status: DC

## 2016-08-04 NOTE — Telephone Encounter (Signed)
Patient has several questions. If her insurance does not cover the VSL, what is a good alternative?  She is taking only Tylenol for her headaches, but it is not effective. Is there a different OTC pain medication she can take that is not off limits with her colitis?

## 2016-08-04 NOTE — Telephone Encounter (Signed)
-----   Message from Mauri Pole, MD sent at 08/01/2016  1:18 PM EDT ----- She has nonobstructive small bowel left kidney stone. Mild mucosal enhancement and terminal ileum otherwise rest of the small and large bowel appeared unremarkable. Biopsies taken from terminal ileum were unremarkable. She has mild left-sided chronic colitis based on biopsies. Continue Apriso and probiotic. Given no evidence of significant active Crohn's disease will hold off starting biologic or immunomodulator at this point of time. Follow-up in office visit in 6-8 weeks Please inform patient the results. Thanks

## 2016-08-04 NOTE — Telephone Encounter (Signed)
Probotics are usually not covered by insurance. She may be able to get good pricing for VSL # 3 online or at [employer]. We'll need to discuss with PMD/neurology regarding management of headache if Tylenol is not working. Please schedule follow-up visit to make sure her symptoms are improving

## 2016-08-15 ENCOUNTER — Telehealth: Payer: Self-pay

## 2016-08-15 NOTE — Telephone Encounter (Signed)
Pt scheduled to see Dr. Silverio Decamp 10/03/16@8 :45am. Letter mailed to pt.

## 2016-08-15 NOTE — Telephone Encounter (Signed)
-----   Message from Mauri Pole, MD sent at 08/15/2016  3:36 PM EDT ----- Patient with Crohn's colitis, can you please schedule follow up visit, next available.  Thanks

## 2016-08-21 ENCOUNTER — Encounter (HOSPITAL_COMMUNITY): Payer: BLUE CROSS/BLUE SHIELD

## 2016-10-01 DIAGNOSIS — M5382 Other specified dorsopathies, cervical region: Secondary | ICD-10-CM | POA: Diagnosis not present

## 2016-10-01 DIAGNOSIS — M62830 Muscle spasm of back: Secondary | ICD-10-CM | POA: Diagnosis not present

## 2016-10-01 DIAGNOSIS — M9901 Segmental and somatic dysfunction of cervical region: Secondary | ICD-10-CM | POA: Diagnosis not present

## 2016-10-01 DIAGNOSIS — M542 Cervicalgia: Secondary | ICD-10-CM | POA: Diagnosis not present

## 2016-10-03 ENCOUNTER — Encounter: Payer: Self-pay | Admitting: Gastroenterology

## 2016-10-03 ENCOUNTER — Other Ambulatory Visit (INDEPENDENT_AMBULATORY_CARE_PROVIDER_SITE_OTHER): Payer: BLUE CROSS/BLUE SHIELD

## 2016-10-03 ENCOUNTER — Ambulatory Visit (INDEPENDENT_AMBULATORY_CARE_PROVIDER_SITE_OTHER): Payer: BLUE CROSS/BLUE SHIELD | Admitting: Gastroenterology

## 2016-10-03 VITALS — BP 100/70 | HR 72 | Ht 61.5 in | Wt 139.4 lb

## 2016-10-03 DIAGNOSIS — M1990 Unspecified osteoarthritis, unspecified site: Secondary | ICD-10-CM | POA: Diagnosis not present

## 2016-10-03 DIAGNOSIS — K5080 Crohn's disease of both small and large intestine without complications: Secondary | ICD-10-CM | POA: Diagnosis not present

## 2016-10-03 DIAGNOSIS — K5909 Other constipation: Secondary | ICD-10-CM | POA: Diagnosis not present

## 2016-10-03 DIAGNOSIS — G8929 Other chronic pain: Secondary | ICD-10-CM

## 2016-10-03 DIAGNOSIS — M545 Low back pain: Secondary | ICD-10-CM

## 2016-10-03 LAB — CBC WITH DIFFERENTIAL/PLATELET
BASOS ABS: 0.1 10*3/uL (ref 0.0–0.1)
Basophils Relative: 0.5 % (ref 0.0–3.0)
Eosinophils Absolute: 0.1 10*3/uL (ref 0.0–0.7)
Eosinophils Relative: 0.9 % (ref 0.0–5.0)
HCT: 42.4 % (ref 36.0–46.0)
HEMOGLOBIN: 14.6 g/dL (ref 12.0–15.0)
LYMPHS ABS: 3.6 10*3/uL (ref 0.7–4.0)
Lymphocytes Relative: 34.1 % (ref 12.0–46.0)
MCHC: 34.3 g/dL (ref 30.0–36.0)
MCV: 89.3 fl (ref 78.0–100.0)
MONO ABS: 0.9 10*3/uL (ref 0.1–1.0)
Monocytes Relative: 8.6 % (ref 3.0–12.0)
NEUTROS PCT: 55.9 % (ref 43.0–77.0)
Neutro Abs: 5.9 10*3/uL (ref 1.4–7.7)
Platelets: 626 10*3/uL — ABNORMAL HIGH (ref 150.0–400.0)
RBC: 4.75 Mil/uL (ref 3.87–5.11)
RDW: 13.9 % (ref 11.5–15.5)
WBC: 10.6 10*3/uL — AB (ref 4.0–10.5)

## 2016-10-03 LAB — COMPREHENSIVE METABOLIC PANEL
ALK PHOS: 56 U/L (ref 39–117)
ALT: 19 U/L (ref 0–35)
AST: 17 U/L (ref 0–37)
Albumin: 4.6 g/dL (ref 3.5–5.2)
BILIRUBIN TOTAL: 0.4 mg/dL (ref 0.2–1.2)
BUN: 9 mg/dL (ref 6–23)
CO2: 27 mEq/L (ref 19–32)
CREATININE: 0.63 mg/dL (ref 0.40–1.20)
Calcium: 9.7 mg/dL (ref 8.4–10.5)
Chloride: 106 mEq/L (ref 96–112)
GFR: 114.08 mL/min (ref >60.00)
GLUCOSE: 99 mg/dL (ref 70–99)
Potassium: 3.9 mEq/L (ref 3.5–5.1)
Sodium: 140 mEq/L (ref 135–145)
TOTAL PROTEIN: 7.1 g/dL (ref 6.0–8.3)

## 2016-10-03 LAB — HIGH SENSITIVITY CRP: CRP, High Sensitivity: 0.52 mg/L (ref 0.000–5.000)

## 2016-10-03 MED ORDER — VSL#3 PO CAPS: ORAL_CAPSULE | ORAL | 0 refills | Status: DC

## 2016-10-03 MED ORDER — LINACLOTIDE 72 MCG PO CAPS: 72.0000 ug | ORAL_CAPSULE | Freq: Every day | ORAL | 3 refills | Status: DC

## 2016-10-03 NOTE — Progress Notes (Signed)
Jessica Cordova    [id number]    1980-05-05  Primary Care Physician:Lee, Joylene Igo, MD  Referring Physician: Cher Nakai, MD [address] [place], [place] [address]  Chief complaint:  Crohn's disease  HPI: 36 year old female with history of Crohn's disease with terminal ileal and colonic involvement, initially diagnosed in 1992 is here for follow-up visit. She was initially managed on chronic prednisone, subsequently was on Remicade around early 2000's and then she was switched to Humira for unclear reason. Patient does not recall having any allergic reaction or issue with Remicade. She was in clinical remission on Humira 40 mg every 2 weeks but she stopped taking it in 2016 due to increased out-of-pocket expenses with change in insurance. She saw Main office in April 2018 with diarrhea and concern for possible Crohn's flare.  Colonoscopy was negative for active colitis and CT enterography did not show any evidence of active disease in the small bowel other than mild thickening of the terminal ileum.  She is currently on Apriso and probiotic. Denies diarrhea or blood in stool. She is having worsening constipation with bowel movement every 4-5 days followed by multiple bowel movements.  She complains of severe neck pain and lower back pain associated with stiffness and multiple joint pain. She has had back pain in the past. She was seeing chiropractor intermittently. Doesnt think she ever had work up to evaluate for possible autoimmune arthritis and hasn't seen a rheumatologist.    Outpatient Encounter Prescriptions as of 10/03/2016  Medication Sig  . dicyclomine (BENTYL) 10 MG capsule Take 1 capsule (10 mg total) by mouth 3 (three) times daily before meals.  . Dietary Management Product (VSL#3) PACK Take 1 each by mouth 2 (two) times daily.  . fluticasone (FLONASE) 50 MCG/ACT nasal spray Place 1 spray into both nostrils as needed for allergies or rhinitis.  Marland Kitchen  mesalamine (APRISO) 0.375 g 24 hr capsule Take 1 capsule (0.375 g total) by mouth daily.  . pantoprazole (PROTONIX) 40 MG tablet Take 1 tablet (40 mg total) by mouth daily.  . promethazine (PHENERGAN) 25 MG tablet Take 0.5 tablets (12.5 mg total) by mouth every 6 (six) hours as needed for nausea or vomiting.   Facility-Administered Encounter Medications as of 10/03/2016  Medication  . 0.9 %  sodium chloride infusion  . acetaminophen (TYLENOL) tablet 650 mg  . diphenhydrAMINE (BENADRYL) capsule 50 mg  . inFLIXimab (REMICADE) 5 mg/kg = 300 mg in sodium chloride 0.9 % 250 mL infusion    Allergies as of 10/03/2016  . (No Known Allergies)    Past Medical History:  Diagnosis Date  . Anxiety   . Crohn's disease ([place])   . IBS (irritable bowel syndrome)   . Migraines   . Nephrolithiasis   . Neutrophilic leukocytosis   . Pseudotumor cerebri   . Renal colic     Past Surgical History:  Procedure Laterality Date  . FOOT SURGERY     left x 2  . LEEP    . TONSILLECTOMY    . WISDOM TOOTH EXTRACTION      Family History  Problem Relation Age of Onset  . Colon cancer Paternal Aunt   . Heart disease Maternal Grandfather   . Heart disease Paternal Grandfather   . Hypertension Father     Social History   Social History  . Marital status: Single    Spouse name: N/A  . Number of children: 1  .  Years of education: N/A   Occupational History  .  [employer]   Social History Main Topics  . Smoking status: Former Smoker    Quit date: 03/18/2007  . Smokeless tobacco: Never Used  . Alcohol use No     Comment: occasionally  . Drug use: No  . Sexual activity: Not on file   Other Topics Concern  . Not on file   Social History Narrative  . No narrative on file      Review of systems: Review of Systems  Constitutional: Negative for fever and chills.  HENT: Negative.   Eyes: Negative for blurred vision.  Respiratory: Negative for cough, shortness of breath and  wheezing.   Cardiovascular: Negative for chest pain and palpitations.  Gastrointestinal: as per HPI Genitourinary: Negative for dysuria, urgency, frequency and hematuria.  Musculoskeletal: Positive for myalgias, back pain and joint pain.  Skin: Negative for itching and rash.  Neurological: Negative for dizziness, tremors, focal weakness, seizures and loss of consciousness.  Endo/Heme/Allergies: Positive for seasonal allergies.  Psychiatric/Behavioral: Negative for depression, suicidal ideas and hallucinations.  All other systems reviewed and are negative.   Physical Exam: Vitals:   10/03/16 0856  BP: 100/70  Pulse: 72   Body mass index is 25.91 kg/m. Gen:      No acute distress HEENT:  EOMI, sclera anicteric Neck:     No masses; no thyromegaly Lungs:    Clear to auscultation bilaterally; normal respiratory effort CV:         Regular rate and rhythm; no murmurs Abd:      + bowel sounds; soft, non-tender; no palpable masses, no distension Ext:    No edema; adequate peripheral perfusion Skin:      Warm and dry; no rash Neuro: alert and oriented x 3 Psych: normal mood and affect  Data Reviewed:  Reviewed labs, radiology imaging, old records and pertinent past GI work up   Assessment and Plan/Recommendations:  36 year old female with history of ileocolonic Crohn's disease here for follow-up visit. Patient appears to be in clinical remission for Crohn's Continue Apriso daily and probiotic VSL#3 112 billion units daily We will hold off starting anti-TNF at this point given no evidence of active ileal or colonic inflammation Follow-up CRP, CBC and CMP  Constipation: Start Linzess 72 g daily Increase dietary fluid and fiber intake  Back pain, arthralgia and stiffness in joints: Will refer to Dr. Estanislado Pandy to evaluate for possible autoimmune arthritis  Return in 3 months or sooner if needed  25 minutes was spent face-to-face with the patient. Greater than 50% of the time  used for counseling as well as treatment plan and follow-up. She had multiple questions which were answered to her satisfaction  Damaris Hippo , MD [phone] Mon-Fri 8a-5p [phone] after 5p, weekends, holidays  CC: Cher Nakai, MD

## 2016-10-03 NOTE — Patient Instructions (Signed)
Go to the basement for labs today  You have been scheduled for a bone density test on _____ at ______. Please arrive 15 minutes prior to your scheduled appointment to radiology on the basement floor of [facility] location for this test. If you need to cancel or reschedule for any reason, please contact radiology at [phone].  Preparation for test is as follows:   If you are taking calcium, discontinue this 24-48 hours prior to your appointment.   Wear pants with an elastic waistband (or without any metal such as a zipper).   Do not wear an underwire bra.   We do have gowns if you are unable to find appropriate clothing without metal.   Please bring a list of all current medications.   We will send Linzess to your pharmacy   Try [facility] for the cheapest price of VSL # 3 112 units , take 1 daily

## 2016-10-06 DIAGNOSIS — M5382 Other specified dorsopathies, cervical region: Secondary | ICD-10-CM | POA: Diagnosis not present

## 2016-10-06 DIAGNOSIS — M62830 Muscle spasm of back: Secondary | ICD-10-CM | POA: Diagnosis not present

## 2016-10-06 DIAGNOSIS — M542 Cervicalgia: Secondary | ICD-10-CM | POA: Diagnosis not present

## 2016-10-06 DIAGNOSIS — M9901 Segmental and somatic dysfunction of cervical region: Secondary | ICD-10-CM | POA: Diagnosis not present

## 2016-10-10 DIAGNOSIS — M9901 Segmental and somatic dysfunction of cervical region: Secondary | ICD-10-CM | POA: Diagnosis not present

## 2016-10-10 DIAGNOSIS — M62830 Muscle spasm of back: Secondary | ICD-10-CM | POA: Diagnosis not present

## 2016-10-10 DIAGNOSIS — M542 Cervicalgia: Secondary | ICD-10-CM | POA: Diagnosis not present

## 2016-10-10 DIAGNOSIS — M5382 Other specified dorsopathies, cervical region: Secondary | ICD-10-CM | POA: Diagnosis not present

## 2016-10-13 DIAGNOSIS — M62830 Muscle spasm of back: Secondary | ICD-10-CM | POA: Diagnosis not present

## 2016-10-13 DIAGNOSIS — M5382 Other specified dorsopathies, cervical region: Secondary | ICD-10-CM | POA: Diagnosis not present

## 2016-10-13 DIAGNOSIS — M9901 Segmental and somatic dysfunction of cervical region: Secondary | ICD-10-CM | POA: Diagnosis not present

## 2016-10-13 DIAGNOSIS — M542 Cervicalgia: Secondary | ICD-10-CM | POA: Diagnosis not present

## 2016-10-14 DIAGNOSIS — M542 Cervicalgia: Secondary | ICD-10-CM | POA: Diagnosis not present

## 2016-10-14 DIAGNOSIS — M62830 Muscle spasm of back: Secondary | ICD-10-CM | POA: Diagnosis not present

## 2016-10-14 DIAGNOSIS — M9901 Segmental and somatic dysfunction of cervical region: Secondary | ICD-10-CM | POA: Diagnosis not present

## 2016-10-14 DIAGNOSIS — M5382 Other specified dorsopathies, cervical region: Secondary | ICD-10-CM | POA: Diagnosis not present

## 2016-10-15 ENCOUNTER — Ambulatory Visit (INDEPENDENT_AMBULATORY_CARE_PROVIDER_SITE_OTHER)
Admission: RE | Admit: 2016-10-15 | Discharge: 2016-10-15 | Disposition: A | Discharge status: Home / Self Care | Payer: BLUE CROSS/BLUE SHIELD | Source: Ambulatory Visit | Attending: Gastroenterology | Admitting: Gastroenterology

## 2016-10-15 DIAGNOSIS — K5080 Crohn's disease of both small and large intestine without complications: Secondary | ICD-10-CM

## 2016-10-15 DIAGNOSIS — K5909 Other constipation: Secondary | ICD-10-CM | POA: Diagnosis not present

## 2016-10-15 DIAGNOSIS — M1990 Unspecified osteoarthritis, unspecified site: Secondary | ICD-10-CM | POA: Diagnosis not present

## 2016-10-16 ENCOUNTER — Telehealth: Payer: Self-pay | Admitting: *Deleted

## 2016-10-16 ENCOUNTER — Other Ambulatory Visit: Payer: Self-pay | Admitting: *Deleted

## 2016-10-16 DIAGNOSIS — M62830 Muscle spasm of back: Secondary | ICD-10-CM | POA: Diagnosis not present

## 2016-10-16 DIAGNOSIS — M5382 Other specified dorsopathies, cervical region: Secondary | ICD-10-CM | POA: Diagnosis not present

## 2016-10-16 DIAGNOSIS — M542 Cervicalgia: Secondary | ICD-10-CM | POA: Diagnosis not present

## 2016-10-16 DIAGNOSIS — K50018 Crohn's disease of small intestine with other complication: Secondary | ICD-10-CM

## 2016-10-16 DIAGNOSIS — M9901 Segmental and somatic dysfunction of cervical region: Secondary | ICD-10-CM | POA: Diagnosis not present

## 2016-10-16 DIAGNOSIS — M1990 Unspecified osteoarthritis, unspecified site: Secondary | ICD-10-CM

## 2016-10-16 MED ORDER — DICYCLOMINE HCL 10 MG PO CAPS: 10.0000 mg | ORAL_CAPSULE | Freq: Three times a day (TID) | ORAL | 3 refills | Status: DC

## 2016-10-16 MED ORDER — PANTOPRAZOLE SODIUM 40 MG PO TBEC: 40.0000 mg | DELAYED_RELEASE_TABLET | Freq: Every day | ORAL | 3 refills | Status: DC

## 2016-10-16 NOTE — Telephone Encounter (Signed)
Refill sent to pharmacy today for bentyl

## 2016-10-16 NOTE — Telephone Encounter (Signed)
Faxed over notes and demographics today to Dr Anette Guarneri office for an appointment for Possible Autoimmune Arthritis due to Crohns disease. They said they would contact the patient to schedule once approved by the doctor

## 2016-10-16 NOTE — Telephone Encounter (Signed)
Protonix 90 day sent to pharmacy

## 2016-10-21 DIAGNOSIS — K5909 Other constipation: Secondary | ICD-10-CM | POA: Diagnosis not present

## 2016-10-21 DIAGNOSIS — M1990 Unspecified osteoarthritis, unspecified site: Secondary | ICD-10-CM | POA: Diagnosis not present

## 2016-10-21 DIAGNOSIS — K5080 Crohn's disease of both small and large intestine without complications: Secondary | ICD-10-CM | POA: Diagnosis not present

## 2016-10-22 DIAGNOSIS — M542 Cervicalgia: Secondary | ICD-10-CM | POA: Diagnosis not present

## 2016-10-22 DIAGNOSIS — M5382 Other specified dorsopathies, cervical region: Secondary | ICD-10-CM | POA: Diagnosis not present

## 2016-10-22 DIAGNOSIS — M9901 Segmental and somatic dysfunction of cervical region: Secondary | ICD-10-CM | POA: Diagnosis not present

## 2016-10-22 DIAGNOSIS — M62830 Muscle spasm of back: Secondary | ICD-10-CM | POA: Diagnosis not present

## 2016-10-24 DIAGNOSIS — M542 Cervicalgia: Secondary | ICD-10-CM | POA: Diagnosis not present

## 2016-10-24 DIAGNOSIS — M62830 Muscle spasm of back: Secondary | ICD-10-CM | POA: Diagnosis not present

## 2016-10-24 DIAGNOSIS — M9901 Segmental and somatic dysfunction of cervical region: Secondary | ICD-10-CM | POA: Diagnosis not present

## 2016-10-24 DIAGNOSIS — M5382 Other specified dorsopathies, cervical region: Secondary | ICD-10-CM | POA: Diagnosis not present

## 2016-10-27 DIAGNOSIS — M5382 Other specified dorsopathies, cervical region: Secondary | ICD-10-CM | POA: Diagnosis not present

## 2016-10-27 DIAGNOSIS — M9901 Segmental and somatic dysfunction of cervical region: Secondary | ICD-10-CM | POA: Diagnosis not present

## 2016-10-27 DIAGNOSIS — M62830 Muscle spasm of back: Secondary | ICD-10-CM | POA: Diagnosis not present

## 2016-10-27 DIAGNOSIS — M542 Cervicalgia: Secondary | ICD-10-CM | POA: Diagnosis not present

## 2016-11-03 ENCOUNTER — Telehealth: Payer: Self-pay | Admitting: Gastroenterology

## 2016-11-04 NOTE — Telephone Encounter (Signed)
Patient aware her DEXA is unreviewed at this point. Read the impression to her. She will wait for recommendations from her provider. Thanks me for my call.

## 2016-11-05 DIAGNOSIS — M5382 Other specified dorsopathies, cervical region: Secondary | ICD-10-CM | POA: Diagnosis not present

## 2016-11-05 DIAGNOSIS — M542 Cervicalgia: Secondary | ICD-10-CM | POA: Diagnosis not present

## 2016-11-05 DIAGNOSIS — M9901 Segmental and somatic dysfunction of cervical region: Secondary | ICD-10-CM | POA: Diagnosis not present

## 2016-11-05 DIAGNOSIS — M62830 Muscle spasm of back: Secondary | ICD-10-CM | POA: Diagnosis not present

## 2016-11-12 DIAGNOSIS — M9901 Segmental and somatic dysfunction of cervical region: Secondary | ICD-10-CM | POA: Diagnosis not present

## 2016-11-12 DIAGNOSIS — M62830 Muscle spasm of back: Secondary | ICD-10-CM | POA: Diagnosis not present

## 2016-11-12 DIAGNOSIS — M542 Cervicalgia: Secondary | ICD-10-CM | POA: Diagnosis not present

## 2016-12-03 DIAGNOSIS — Z6824 Body mass index (BMI) 24.0-24.9, adult: Secondary | ICD-10-CM | POA: Diagnosis not present

## 2016-12-03 DIAGNOSIS — Z01419 Encounter for gynecological examination (general) (routine) without abnormal findings: Secondary | ICD-10-CM | POA: Diagnosis not present

## 2016-12-26 DIAGNOSIS — K5090 Crohn's disease, unspecified, without complications: Secondary | ICD-10-CM | POA: Diagnosis not present

## 2016-12-26 DIAGNOSIS — K5190 Ulcerative colitis, unspecified, without complications: Secondary | ICD-10-CM | POA: Diagnosis not present

## 2016-12-26 DIAGNOSIS — J3089 Other allergic rhinitis: Secondary | ICD-10-CM | POA: Diagnosis not present

## 2016-12-26 DIAGNOSIS — J208 Acute bronchitis due to other specified organisms: Secondary | ICD-10-CM | POA: Diagnosis not present

## 2017-01-09 DIAGNOSIS — R6882 Decreased libido: Secondary | ICD-10-CM | POA: Diagnosis not present

## 2017-01-09 DIAGNOSIS — Z6824 Body mass index (BMI) 24.0-24.9, adult: Secondary | ICD-10-CM | POA: Diagnosis not present

## 2017-02-04 ENCOUNTER — Telehealth: Payer: Self-pay | Admitting: *Deleted

## 2017-02-04 MED ORDER — MESALAMINE ER 0.375 G PO CP24
375.0000 mg | ORAL_CAPSULE | Freq: Every day | ORAL | 3 refills | Status: DC
Start: 2017-02-04 — End: 2018-03-01

## 2017-02-04 NOTE — Telephone Encounter (Signed)
Sent in 90 day supply of Apriso as requested from fax from pharmacy

## 2017-03-31 DIAGNOSIS — J3089 Other allergic rhinitis: Secondary | ICD-10-CM | POA: Diagnosis not present

## 2017-03-31 DIAGNOSIS — J0100 Acute maxillary sinusitis, unspecified: Secondary | ICD-10-CM | POA: Diagnosis not present

## 2017-03-31 DIAGNOSIS — K5090 Crohn's disease, unspecified, without complications: Secondary | ICD-10-CM | POA: Diagnosis not present

## 2017-03-31 DIAGNOSIS — K5190 Ulcerative colitis, unspecified, without complications: Secondary | ICD-10-CM | POA: Diagnosis not present

## 2017-06-01 DIAGNOSIS — M9902 Segmental and somatic dysfunction of thoracic region: Secondary | ICD-10-CM | POA: Diagnosis not present

## 2017-06-01 DIAGNOSIS — M9901 Segmental and somatic dysfunction of cervical region: Secondary | ICD-10-CM | POA: Diagnosis not present

## 2017-06-01 DIAGNOSIS — M5383 Other specified dorsopathies, cervicothoracic region: Secondary | ICD-10-CM | POA: Diagnosis not present

## 2017-06-01 DIAGNOSIS — M9903 Segmental and somatic dysfunction of lumbar region: Secondary | ICD-10-CM | POA: Diagnosis not present

## 2017-06-03 DIAGNOSIS — M9903 Segmental and somatic dysfunction of lumbar region: Secondary | ICD-10-CM | POA: Diagnosis not present

## 2017-06-03 DIAGNOSIS — M5383 Other specified dorsopathies, cervicothoracic region: Secondary | ICD-10-CM | POA: Diagnosis not present

## 2017-06-03 DIAGNOSIS — M9901 Segmental and somatic dysfunction of cervical region: Secondary | ICD-10-CM | POA: Diagnosis not present

## 2017-06-03 DIAGNOSIS — M9902 Segmental and somatic dysfunction of thoracic region: Secondary | ICD-10-CM | POA: Diagnosis not present

## 2017-11-26 DIAGNOSIS — J0100 Acute maxillary sinusitis, unspecified: Secondary | ICD-10-CM | POA: Diagnosis not present

## 2017-11-26 DIAGNOSIS — K5190 Ulcerative colitis, unspecified, without complications: Secondary | ICD-10-CM | POA: Diagnosis not present

## 2017-11-26 DIAGNOSIS — J3089 Other allergic rhinitis: Secondary | ICD-10-CM | POA: Diagnosis not present

## 2017-11-26 DIAGNOSIS — K5090 Crohn's disease, unspecified, without complications: Secondary | ICD-10-CM | POA: Diagnosis not present

## 2018-02-24 DIAGNOSIS — Z01419 Encounter for gynecological examination (general) (routine) without abnormal findings: Secondary | ICD-10-CM | POA: Diagnosis not present

## 2018-02-24 DIAGNOSIS — Z6826 Body mass index (BMI) 26.0-26.9, adult: Secondary | ICD-10-CM | POA: Diagnosis not present

## 2018-02-26 ENCOUNTER — Ambulatory Visit: Payer: BLUE CROSS/BLUE SHIELD | Admitting: Gastroenterology

## 2018-02-26 ENCOUNTER — Encounter: Payer: Self-pay | Admitting: Gastroenterology

## 2018-02-26 ENCOUNTER — Other Ambulatory Visit (INDEPENDENT_AMBULATORY_CARE_PROVIDER_SITE_OTHER): Payer: BLUE CROSS/BLUE SHIELD

## 2018-02-26 VITALS — BP 106/70 | HR 72 | Ht 61.5 in | Wt 139.2 lb

## 2018-02-26 DIAGNOSIS — K5080 Crohn's disease of both small and large intestine without complications: Secondary | ICD-10-CM | POA: Diagnosis not present

## 2018-02-26 LAB — CBC WITH DIFFERENTIAL/PLATELET
Basophils Absolute: 0 10*3/uL (ref 0.0–0.1)
Basophils Relative: 0.5 % (ref 0.0–3.0)
EOS PCT: 2 % (ref 0.0–5.0)
Eosinophils Absolute: 0.2 10*3/uL (ref 0.0–0.7)
HEMATOCRIT: 40.9 % (ref 36.0–46.0)
HEMOGLOBIN: 14.4 g/dL (ref 12.0–15.0)
LYMPHS ABS: 3.5 10*3/uL (ref 0.7–4.0)
LYMPHS PCT: 39.6 % (ref 12.0–46.0)
MCHC: 35.2 g/dL (ref 30.0–36.0)
MCV: 87.1 fl (ref 78.0–100.0)
MONOS PCT: 7.3 % (ref 3.0–12.0)
Monocytes Absolute: 0.6 10*3/uL (ref 0.1–1.0)
NEUTROS PCT: 50.6 % (ref 43.0–77.0)
Neutro Abs: 4.4 10*3/uL (ref 1.4–7.7)
Platelets: 611 10*3/uL — ABNORMAL HIGH (ref 150.0–400.0)
RBC: 4.7 Mil/uL (ref 3.87–5.11)
RDW: 13.8 % (ref 11.5–15.5)
WBC: 8.8 10*3/uL (ref 4.0–10.5)

## 2018-02-26 LAB — COMPREHENSIVE METABOLIC PANEL
ALK PHOS: 55 U/L (ref 39–117)
ALT: 11 U/L (ref 0–35)
AST: 13 U/L (ref 0–37)
Albumin: 4.5 g/dL (ref 3.5–5.2)
BUN: 12 mg/dL (ref 6–23)
CO2: 25 mEq/L (ref 19–32)
Calcium: 9.4 mg/dL (ref 8.4–10.5)
Chloride: 106 mEq/L (ref 96–112)
Creatinine, Ser: 0.54 mg/dL (ref 0.40–1.20)
GFR: 135.22 mL/min (ref >60.00)
GLUCOSE: 89 mg/dL (ref 70–99)
POTASSIUM: 4 meq/L (ref 3.5–5.1)
Sodium: 140 mEq/L (ref 135–145)
TOTAL PROTEIN: 7.1 g/dL (ref 6.0–8.3)
Total Bilirubin: 0.3 mg/dL (ref 0.2–1.2)

## 2018-02-26 LAB — VITAMIN B12: Vitamin B-12: 394 pg/mL (ref 211–911)

## 2018-02-26 LAB — IBC PANEL
Iron: 95 ug/dL (ref 42–145)
SATURATION RATIOS: 29.4 % (ref 20.0–50.0)
Transferrin: 231 mg/dL (ref 212.0–360.0)

## 2018-02-26 LAB — FERRITIN: FERRITIN: 50.5 ng/mL (ref 10.0–291.0)

## 2018-02-26 LAB — FOLATE: Folate: 12.8 ng/mL (ref >5.9)

## 2018-02-26 LAB — HIGH SENSITIVITY CRP: CRP HIGH SENSITIVITY: 1.49 mg/L (ref 0.000–5.000)

## 2018-02-26 NOTE — Progress Notes (Signed)
Jessica Cordova    [id number]    June 15, 1980  Primary Care Physician:Lee, Joylene Igo, MD  Referring Physician: Cher Nakai, MD [address] [place], [place] [address]  Chief complaint: Crohn's disease  HPI: 37 year old female with history of Crohn's disease with involvement of terminal ileum and colon, diagnosed in 1992 here for follow-up visit. Her symptoms are currently well controlled on daily mesalamine.  Denies any diarrhea or blood per rectum.  She has occasional intermittent lower abdominal cramping and diarrhea especially if she does not take the medication Heartburn and GERD symptoms well controlled on Protonix daily  Pertinent GI history: She was initially managed on chronic prednisone, subsequently was on Remicade around early 2000's and then she was switched to Humira for unclear reason. Patient does not recall having any allergic reaction or issue with Remicade. She was in clinical remission on Humira 40 mg every 2 weeks but she stopped taking it in 2016 due to increased out-of-pocket expenses with change in insurance. Colonoscopy May 2018 was negative for active colitis and CT enterography May 2018 did not show any evidence of active disease in the small bowel other than mild thickening of the terminal ileum.   Outpatient Encounter Medications as of 02/26/2018  Medication Sig  . dicyclomine (BENTYL) 10 MG capsule Take 1 capsule (10 mg total) by mouth 3 (three) times daily before meals.  . fluticasone (FLONASE) 50 MCG/ACT nasal spray Place 1 spray into both nostrils as needed for allergies or rhinitis.  Marland Kitchen mesalamine (APRISO) 0.375 g 24 hr capsule Take 1 capsule (0.375 g total) by mouth daily.  . pantoprazole (PROTONIX) 40 MG tablet Take 1 tablet (40 mg total) by mouth daily.  . promethazine (PHENERGAN) 25 MG tablet Take 0.5 tablets (12.5 mg total) by mouth every 6 (six) hours as needed for nausea or vomiting.  . [DISCONTINUED] Dietary Management Product  (VSL#3) PACK Take 1 each by mouth 2 (two) times daily.  . [DISCONTINUED] linaclotide (LINZESS) 72 MCG capsule Take 1 capsule (72 mcg total) by mouth daily before breakfast.  . [DISCONTINUED] linaclotide (LINZESS) 72 MCG capsule Take 1 capsule (72 mcg total) by mouth daily before breakfast.  . [DISCONTINUED] Probiotic Product (VSL#3) CAPS Take one capsule daily   Facility-Administered Encounter Medications as of 02/26/2018  Medication  . 0.9 %  sodium chloride infusion  . acetaminophen (TYLENOL) tablet 650 mg  . diphenhydrAMINE (BENADRYL) capsule 50 mg  . inFLIXimab (REMICADE) 5 mg/kg = 300 mg in sodium chloride 0.9 % 250 mL infusion    Allergies as of 02/26/2018  . (No Known Allergies)    Past Medical History:  Diagnosis Date  . Anxiety   . Crohn's disease ([place])   . IBS (irritable bowel syndrome)   . Migraines   . Nephrolithiasis   . Neutrophilic leukocytosis   . Pseudotumor cerebri   . Renal colic     Past Surgical History:  Procedure Laterality Date  . FOOT SURGERY     left x 2  . LEEP    . TONSILLECTOMY    . WISDOM TOOTH EXTRACTION      Family History  Problem Relation Age of Onset  . Colon cancer Paternal Aunt   . Heart disease Maternal Grandfather   . Heart disease Paternal Grandfather   . Hypertension Father     Social History   Socioeconomic History  . Marital status: Single    Spouse name: Not on file  .  Number of children: 1  . Years of education: Not on file  . Highest education level: Not on file  Occupational History    Employer: [employer]  Social Needs  . Financial resource strain: Not on file  . Food insecurity:    Worry: Not on file    Inability: Not on file  . Transportation needs:    Medical: Not on file    Non-medical: Not on file  Tobacco Use  . Smoking status: Former Smoker    Last attempt to quit: 03/18/2007    Years since quitting: 10.9  . Smokeless tobacco: Never Used  Substance and Sexual Activity  . Alcohol use:  No    Comment: occasionally  . Drug use: No  . Sexual activity: Not on file  Lifestyle  . Physical activity:    Days per week: Not on file    Minutes per session: Not on file  . Stress: Not on file  Relationships  . Social connections:    Talks on phone: Not on file    Gets together: Not on file    Attends religious service: Not on file    Active member of club or organization: Not on file    Attends meetings of clubs or organizations: Not on file    Relationship status: Not on file  . Intimate partner violence:    Fear of current or ex partner: Not on file    Emotionally abused: Not on file    Physically abused: Not on file    Forced sexual activity: Not on file  Other Topics Concern  . Not on file  Social History Narrative  . Not on file      Review of systems: Review of Systems  Constitutional: Negative for fever and chills.  HENT: Negative.   Eyes: Negative for blurred vision.  Respiratory: Negative for cough, shortness of breath and wheezing.   Cardiovascular: Negative for chest pain and palpitations.  Gastrointestinal: as per HPI Genitourinary: Negative for dysuria, urgency, frequency and hematuria.  Musculoskeletal: Positive for myalgias, back pain and joint pain.  Skin: Negative for itching and rash.  Neurological: Negative for dizziness, tremors, focal weakness, seizures and loss of consciousness.  Endo/Heme/Allergies: Positive for seasonal allergies.  Psychiatric/Behavioral: Negative for depression, suicidal ideas and hallucinations.  All other systems reviewed and are negative.   Physical Exam: Vitals:   02/26/18 0856  BP: 106/70  Pulse: 72   Body mass index is 25.88 kg/m. Gen:      No acute distress HEENT:  EOMI, sclera anicteric Neck:     No masses; no thyromegaly Lungs:    Clear to auscultation bilaterally; normal respiratory effort CV:         Regular rate and rhythm; no murmurs Abd:      + bowel sounds; soft, non-tender; no palpable masses,  no distension Ext:    No edema; adequate peripheral perfusion Skin:      Warm and dry; no rash Neuro: alert and oriented x 3 Psych: normal mood and affect  Data Reviewed:  Reviewed labs, radiology imaging, old records and pertinent past GI work up   Assessment and Plan/Recommendations:  37 year old female with history of ileocolonic Crohn's disease currently in clinical remission here for follow-up visit Continue Apriso 1.5 g daily Follow-up CBC, CMP and CRP  GERD continue Protonix Antireflux measures  Return in 1 year or sooner if needed  15 minutes was spent face-to-face with the patient. Greater than 50% of the time used  for counseling as well as treatment plan and follow-up. She had multiple questions which were answered to her satisfaction  K. Denzil Magnuson , MD [phone]    CC: Cher Nakai, MD

## 2018-02-26 NOTE — Patient Instructions (Addendum)
Go to the basement today for labs  Follow up in 1 year  We will refill Protonix and Apriso for 1 year   If you are age 36 or older, your body mass index should be between 23-30. Your Body mass index is 25.88 kg/m. If this is out of the aforementioned range listed, please consider follow up with your Primary Care Provider.  If you are age 65 or younger, your body mass index should be between 19-25. Your Body mass index is 25.88 kg/m. If this is out of the aformentioned range listed, please consider follow up with your Primary Care Provider.    Thank you for choosing [facility]  Karleen Hampshire Nandigam,MD

## 2018-03-01 ENCOUNTER — Telehealth: Payer: Self-pay | Admitting: Gastroenterology

## 2018-03-01 MED ORDER — MESALAMINE ER 0.375 G PO CP24: 375.0000 mg | ORAL_CAPSULE | Freq: Every day | ORAL | 3 refills | Status: DC

## 2018-03-01 MED ORDER — PANTOPRAZOLE SODIUM 40 MG PO TBEC: 40.0000 mg | DELAYED_RELEASE_TABLET | Freq: Every day | ORAL | 3 refills | Status: DC

## 2018-03-01 NOTE — Telephone Encounter (Signed)
Medications sent to pharmacy

## 2018-03-02 LAB — VITAMIN D 1,25 DIHYDROXY
VITAMIN D 1, 25 (OH) TOTAL: 73 pg/mL — AB (ref 18–72)
VITAMIN D3 1, 25 (OH): 73 pg/mL
Vitamin D2 1, 25 (OH)2: <8 pg/mL

## 2018-03-23 ENCOUNTER — Encounter: Payer: Self-pay | Admitting: *Deleted

## 2018-04-14 DIAGNOSIS — J3089 Other allergic rhinitis: Secondary | ICD-10-CM | POA: Diagnosis not present

## 2018-04-14 DIAGNOSIS — F17200 Nicotine dependence, unspecified, uncomplicated: Secondary | ICD-10-CM | POA: Diagnosis not present

## 2018-04-14 DIAGNOSIS — D729 Disorder of white blood cells, unspecified: Secondary | ICD-10-CM | POA: Diagnosis not present

## 2018-04-14 DIAGNOSIS — J028 Acute pharyngitis due to other specified organisms: Secondary | ICD-10-CM | POA: Diagnosis not present

## 2018-04-16 DIAGNOSIS — J3089 Other allergic rhinitis: Secondary | ICD-10-CM | POA: Diagnosis not present

## 2018-04-16 DIAGNOSIS — H1030 Unspecified acute conjunctivitis, unspecified eye: Secondary | ICD-10-CM | POA: Diagnosis not present

## 2018-04-16 DIAGNOSIS — F17200 Nicotine dependence, unspecified, uncomplicated: Secondary | ICD-10-CM | POA: Diagnosis not present

## 2018-04-16 DIAGNOSIS — D729 Disorder of white blood cells, unspecified: Secondary | ICD-10-CM | POA: Diagnosis not present

## 2018-05-05 DIAGNOSIS — R6882 Decreased libido: Secondary | ICD-10-CM | POA: Diagnosis not present

## 2018-05-05 DIAGNOSIS — Z6827 Body mass index (BMI) 27.0-27.9, adult: Secondary | ICD-10-CM | POA: Diagnosis not present

## 2018-05-05 DIAGNOSIS — R635 Abnormal weight gain: Secondary | ICD-10-CM | POA: Diagnosis not present

## 2018-07-12 ENCOUNTER — Telehealth: Payer: Self-pay | Admitting: Gastroenterology

## 2018-07-12 NOTE — Telephone Encounter (Signed)
Patient called said that she feels like the meds are not working as much no more. Feels like they symptoms are slowly creeping up on her.  The medications are apriso and Protonix.

## 2018-07-12 NOTE — Telephone Encounter (Signed)
Spoke with the patient. Admits to feeling under stress during the Covid 19 pandemic. 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 Her complaints are a gradual return of GERD and feels discomfort under the breast bone. Bowel movements are not normal going from diarrhea to constipation. Abdomen feels crampy. Please advise.

## 2018-07-12 NOTE — Telephone Encounter (Signed)
Can you please schedule tele visit next week to discuss all her concerns.  Gaviscon 1 tablet upto 3 times daily as needed for breakthrough heartburn.

## 2018-07-12 NOTE — Telephone Encounter (Signed)
Spoke with the patient. She agrees to the televisit. She is scheduled for 07/23/18. She agrees to try Gaviscon.

## 2018-07-21 ENCOUNTER — Encounter: Payer: Self-pay | Admitting: General Surgery

## 2018-07-22 ENCOUNTER — Ambulatory Visit (INDEPENDENT_AMBULATORY_CARE_PROVIDER_SITE_OTHER): Payer: BLUE CROSS/BLUE SHIELD | Admitting: Gastroenterology

## 2018-07-22 ENCOUNTER — Other Ambulatory Visit: Payer: Self-pay

## 2018-07-22 ENCOUNTER — Encounter: Payer: Self-pay | Admitting: Gastroenterology

## 2018-07-22 ENCOUNTER — Telehealth: Payer: Self-pay | Admitting: Gastroenterology

## 2018-07-22 VITALS — Ht 61.0 in | Wt 148.0 lb

## 2018-07-22 DIAGNOSIS — K5904 Chronic idiopathic constipation: Secondary | ICD-10-CM

## 2018-07-22 DIAGNOSIS — K582 Mixed irritable bowel syndrome: Secondary | ICD-10-CM | POA: Diagnosis not present

## 2018-07-22 DIAGNOSIS — K5080 Crohn's disease of both small and large intestine without complications: Secondary | ICD-10-CM

## 2018-07-22 DIAGNOSIS — K219 Gastro-esophageal reflux disease without esophagitis: Secondary | ICD-10-CM

## 2018-07-22 DIAGNOSIS — K5100 Ulcerative (chronic) pancolitis without complications: Secondary | ICD-10-CM

## 2018-07-22 MED ORDER — DICYCLOMINE HCL 10 MG PO CAPS: 10.0000 mg | ORAL_CAPSULE | Freq: Three times a day (TID) | ORAL | 3 refills | Status: DC

## 2018-07-22 MED ORDER — LINACLOTIDE 72 MCG PO CAPS: 72.0000 ug | ORAL_CAPSULE | Freq: Every day | ORAL | 3 refills | Status: DC

## 2018-07-22 MED ORDER — MESALAMINE ER 0.375 G PO CP24: 750.0000 mg | ORAL_CAPSULE | Freq: Every day | ORAL | 3 refills | Status: AC

## 2018-07-22 MED ORDER — DEXLANSOPRAZOLE 30 MG PO CPDR: 30.0000 mg | DELAYED_RELEASE_CAPSULE | Freq: Every day | ORAL | 3 refills | Status: DC

## 2018-07-22 NOTE — Patient Instructions (Addendum)
Start Linzess 72 mcg daily.  Please send 90-day supply with refills for a year   Switch to Dexilant 30 mg daily, 90-day supply with refill for a year  Stop taking Protonix  Antireflux measures  Increase Apriso to 2 capsules by mouth daily, please send refills for a year  Bentyl 10 mg 3 times daily with meals and at bedtime as needed, refill for a year  All prescriptions sent to your pharmacy  Follow-up in 1 year or sooner if needed   Gastroesophageal Reflux Disease, Adult Gastroesophageal reflux (GER) happens when acid from the stomach flows up into the tube that connects the mouth and the stomach (esophagus). Normally, food travels down the esophagus and stays in the stomach to be digested. However, when a person has GER, food and stomach acid sometimes move back up into the esophagus. If this becomes a more serious problem, the person may be diagnosed with a disease called gastroesophageal reflux disease (GERD). GERD occurs when the reflux:  Happens often.  Causes frequent or severe symptoms.  Causes problems such as damage to the esophagus. When stomach acid comes in contact with the esophagus, the acid may cause soreness (inflammation) in the esophagus. Over time, GERD may create small holes (ulcers) in the lining of the esophagus. What are the causes? This condition is caused by a problem with the muscle between the esophagus and the stomach (lower esophageal sphincter, or LES). Normally, the LES muscle closes after food passes through the esophagus to the stomach. When the LES is weakened or abnormal, it does not close properly, and that allows food and stomach acid to go back up into the esophagus. The LES can be weakened by certain dietary substances, medicines, and medical conditions, including:  Tobacco use.  Pregnancy.  Having a hiatal hernia.  Alcohol use.  Certain foods and beverages, such as coffee, chocolate, onions, and peppermint. What increases the risk? You  are more likely to develop this condition if you:  Have an increased body weight.  Have a connective tissue disorder.  Use NSAID medicines. What are the signs or symptoms? Symptoms of this condition include:  Heartburn.  Difficult or painful swallowing.  The feeling of having a lump in the throat.  Abitter taste in the mouth.  Bad breath.  Having a large amount of saliva.  Having an upset or bloated stomach.  Belching.  Chest pain. Different conditions can cause chest pain. Make sure you see your health care provider if you experience chest pain.  Shortness of breath or wheezing.  Ongoing (chronic) cough or a night-time cough.  Wearing away of tooth enamel.  Weight loss. How is this diagnosed? Your health care provider will take a medical history and perform a physical exam. To determine if you have mild or severe GERD, your health care provider may also monitor how you respond to treatment. You may also have tests, including:  A test to examine your stomach and esophagus with a small camera (endoscopy).  A test thatmeasures the acidity level in your esophagus.  A test thatmeasures how much pressure is on your esophagus.  A barium swallow or modified barium swallow test to show the shape, size, and functioning of your esophagus. How is this treated? The goal of treatment is to help relieve your symptoms and to prevent complications. Treatment for this condition may vary depending on how severe your symptoms are. Your health care provider may recommend:  Changes to your diet.  Medicine.  Surgery. Follow these  instructions at home: Eating and drinking   Follow a diet as recommended by your health care provider. This may involve avoiding foods and drinks such as: ? Coffee and tea (with or without caffeine). ? Drinks that containalcohol. ? Energy drinks and sports drinks. ? Carbonated drinks or sodas. ? Chocolate and cocoa. ? Peppermint and mint  flavorings. ? Garlic and onions. ? Horseradish. ? Spicy and acidic foods, including peppers, chili powder, curry powder, vinegar, hot sauces, and barbecue sauce. ? Citrus fruit juices and citrus fruits, such as oranges, lemons, and limes. ? Tomato-based foods, such as red sauce, chili, salsa, and pizza with red sauce. ? Fried and fatty foods, such as donuts, french fries, potato chips, and high-fat dressings. ? High-fat meats, such as hot dogs and fatty cuts of red and white meats, such as rib eye steak, sausage, ham, and bacon. ? High-fat dairy items, such as whole milk, butter, and cream cheese.  Eat small, frequent meals instead of large meals.  Avoid drinking large amounts of liquid with your meals.  Avoid eating meals during the 2-3 hours before bedtime.  Avoid lying down right after you eat.  Do not exercise right after you eat. Lifestyle   Do not use any products that contain nicotine or tobacco, such as cigarettes, e-cigarettes, and chewing tobacco. If you need help quitting, ask your health care provider.  Try to reduce your stress by using methods such as yoga or meditation. If you need help reducing stress, ask your health care provider.  If you are overweight, reduce your weight to an amount that is healthy for you. Ask your health care provider for guidance about a safe weight loss goal. General instructions  Pay attention to any changes in your symptoms.  Take over-the-counter and prescription medicines only as told by your health care provider. Do not take aspirin, ibuprofen, or other NSAIDs unless your health care provider told you to do so.  Wear loose-fitting clothing. Do not wear anything tight around your waist that causes pressure on your abdomen.  Raise (elevate) the head of your bed about 6 inches (15 cm).  Avoid bending over if this makes your symptoms worse.  Keep all follow-up visits as told by your health care provider. This is important. Contact a  health care provider if:  You have: ? New symptoms. ? Unexplained weight loss. ? Difficulty swallowing or it hurts to swallow. ? Wheezing or a persistent cough. ? A hoarse voice.  Your symptoms do not improve with treatment. Get help right away if you:  Have pain in your arms, neck, jaw, teeth, or back.  Feel sweaty, dizzy, or light-headed.  Have chest pain or shortness of breath.  Vomit and your vomit looks like blood or coffee grounds.  Faint.  Have stool that is bloody or black.  Cannot swallow, drink, or eat. Summary  Gastroesophageal reflux happens when acid from the stomach flows up into the esophagus. GERD is a disease in which the reflux happens often, causes frequent or severe symptoms, or causes problems such as damage to the esophagus.  Treatment for this condition may vary depending on how severe your symptoms are. Your health care provider may recommend diet and lifestyle changes, medicine, or surgery.  Contact a health care provider if you have new or worsening symptoms.  Take over-the-counter and prescription medicines only as told by your health care provider. Do not take aspirin, ibuprofen, or other NSAIDs unless your health care provider told you to do  so.  Keep all follow-up visits as told by your health care provider. This is important. This information is not intended to replace advice given to you by your health care provider. Make sure you discuss any questions you have with your health care provider. Document Released: 12/11/2004 Document Revised: 09/09/2017 Document Reviewed: 09/09/2017 Elsevier Interactive Patient Education  2019 [employer].   I appreciate the  opportunity to care for you  Thank You   Harl Bowie , MD

## 2018-07-22 NOTE — Telephone Encounter (Signed)
Patient called said that she had a virtual visit with Dr. Silverio Decamp and she changed some of her medication that her insurance cannot cover and she cant afford.

## 2018-07-22 NOTE — Telephone Encounter (Signed)
The pt was given the patient assistance information for both linzess, and dexilant.  She will call back with any further cost concerns

## 2018-07-22 NOTE — Progress Notes (Signed)
Jessica Cordova    [id number]    [birth year]  Primary Care Physician:Lee, Joylene Igo, MD  Referring Physician: Cher Nakai, MD [address] [place], [place] [address]  This service was provided via audio and video telemedicine (Doximity) due to [place] 19 pandemic.  Patient location: Home Provider location: Office Used 2 patient identifiers to confirm the correct person. Explained the limitations in evaluation and management via telemedicine. Patient is aware of potential medical charges for this visit.  Patient consented to this virtual visit.  The persons participating in this telemedicine service were myself and the patient   Chief complaint:  Heartburn, constipation,   HPI: 38 year old female with history of Crohn's disease initially diagnosed in 1992 currently in clinical remission.  Last office visit December 2019. She is having change in bowel habits with worsening constipation, has bowel movement once every 3 days and her stomach starts hurting with lower abdominal cramps specially if she does not have a bowel movement for greater than 2 or 3 days, has diarrhea alternating with constipation.  Also complains of worsening heartburn and regurgitation worse towards the later part of the day and at bedtime.  No dysphagia or odynophagia.  No vomiting but has intermittent nausea.  Denies any melena or rectal bleeding.  She feels stress is making her symptoms worse, she has to homeschool and also work due to current pandemic.  Pertinent GI history: She was initially managed on chronic prednisone,subsequently was on Remicade around early 2000'sand then she was switched to Humira for unclear reason.Patient does not recall having any allergic reaction or issue with Remicade.She was in clinical remission on Humira 40 mg every 2 weeks but she stopped taking it in 2016 due to increased out-of-pocket expenses with change in insurance.Colonoscopy May 2018 was negative for  active colitis and CT enterography May 2018 did not show any evidence of active diseasein thesmall bowel other than mild thickening of the terminal ileum.     Outpatient Encounter Medications as of 07/22/2018  Medication Sig  . dicyclomine (BENTYL) 10 MG capsule Take 1 capsule (10 mg total) by mouth 3 (three) times daily before meals. (Patient not taking: Reported on 07/21/2018)  . fluticasone (FLONASE) 50 MCG/ACT nasal spray Place 1 spray into both nostrils as needed for allergies or rhinitis.  Marland Kitchen mesalamine (APRISO) 0.375 g 24 hr capsule Take 1 capsule (0.375 g total) by mouth daily.  . pantoprazole (PROTONIX) 40 MG tablet Take 1 tablet (40 mg total) by mouth daily.  . promethazine (PHENERGAN) 25 MG tablet Take 0.5 tablets (12.5 mg total) by mouth every 6 (six) hours as needed for nausea or vomiting.   Facility-Administered Encounter Medications as of 07/22/2018  Medication  . 0.9 %  sodium chloride infusion  . acetaminophen (TYLENOL) tablet 650 mg  . diphenhydrAMINE (BENADRYL) capsule 50 mg  . inFLIXimab (REMICADE) 5 mg/kg = 300 mg in sodium chloride 0.9 % 250 mL infusion    Allergies as of 07/22/2018  . (No Known Allergies)    Past Medical History:  Diagnosis Date  . Anxiety   . Crohn's disease ([place])   . IBS (irritable bowel syndrome)   . Migraines   . Nephrolithiasis   . Neutrophilic leukocytosis   . Pseudotumor cerebri   . Renal colic   . Vitamin D deficiency, unspecified     Past Surgical History:  Procedure Laterality Date  . FOOT SURGERY     left x 2  .  LEEP    . TONSILLECTOMY    . WISDOM TOOTH EXTRACTION      Family History  Problem Relation Age of Onset  . Colon cancer Paternal Aunt   . Heart disease Maternal Grandfather   . Heart disease Paternal Grandfather   . Hypertension Father     Social History   Socioeconomic History  . Marital status: Single    Spouse name: Not on file  . Number of children: 1  . Years of education: Not on file  . Highest  education level: Not on file  Occupational History    Employer: [employer]  Social Needs  . Financial resource strain: Not on file  . Food insecurity:    Worry: Not on file    Inability: Not on file  . Transportation needs:    Medical: Not on file    Non-medical: Not on file  Tobacco Use  . Smoking status: Former Smoker    Last attempt to quit: 03/18/2007    Years since quitting: 11.3  . Smokeless tobacco: Never Used  Substance and Sexual Activity  . Alcohol use: No    Comment: occasionally  . Drug use: No  . Sexual activity: Not on file  Lifestyle  . Physical activity:    Days per week: Not on file    Minutes per session: Not on file  . Stress: Not on file  Relationships  . Social connections:    Talks on phone: Not on file    Gets together: Not on file    Attends religious service: Not on file    Active member of club or organization: Not on file    Attends meetings of clubs or organizations: Not on file    Relationship status: Not on file  . Intimate partner violence:    Fear of current or ex partner: Not on file    Emotionally abused: Not on file    Physically abused: Not on file    Forced sexual activity: Not on file  Other Topics Concern  . Not on file  Social History Narrative  . Not on file      Review of systems: Review of Systems as per HPI All other systems reviewed and are negative.   Physical Exam: Vitals were not taken and physical exam was not performed during this virtual visit.  Data Reviewed:  Reviewed labs, radiology imaging, old records and pertinent past GI work up   Assessment and Plan/Recommendations:  38 year old female with history of Crohn's disease of terminal ileum and colon currently in remission  Worsening GERD symptoms persistent breakthrough despite Protonix Switch to Dexilant 30 mg daily Stop Protonix Discussed antireflux measures and lifestyle modification  Crohn's disease: We will increase Apriso to 2  capsules daily Currently in remission with no complications  Constipation associated with lower abdominal cramping and alternating diarrhea, irritable bowel syndrome Start Linzess 72 mcg daily, will titrate up dose based on response Bentyl 10 mg 3 times daily with meals and bedtime as needed for abdominal cramping  Follow-up in 1 year or sooner if needed    K. Denzil Magnuson , MD   CC: Cher Nakai, MD

## 2018-07-23 ENCOUNTER — Ambulatory Visit: Payer: BLUE CROSS/BLUE SHIELD | Admitting: Gastroenterology

## 2018-07-23 ENCOUNTER — Telehealth: Payer: Self-pay | Admitting: Gastroenterology

## 2018-07-23 NOTE — Telephone Encounter (Signed)
Pt has been notified and aware. She agrees to try the samples. She will come to the office to pick up

## 2018-07-23 NOTE — Telephone Encounter (Signed)
Linzess 72 mcg is not covered with her  insurance. Out of pocket is $484 a month. Patient states she is miserable and in pain and was looking forward to starting the [place]. We have no samples to offer we only have to 238mg. Please advise.

## 2018-07-23 NOTE — Telephone Encounter (Signed)
She can come to office before we close today and get one week's supply of samples of Linzess 290 micrograms, take one capsule daily or until she feels the constipation is relieved.  It will likely cause more loose stool than the 72 microgram dose Dr. Silverio Decamp was planning, but it is the only dose for which we currently have samples.

## 2018-07-26 NOTE — Telephone Encounter (Signed)
Called patient to see how the [place] worked for her this weekend  Left Message

## 2018-07-26 NOTE — Telephone Encounter (Signed)
Ok thank you. Jessica Cordova, can you please check if she needs a PA or if they will cover for any different diagnosis that we can use? Thanks

## 2018-07-27 NOTE — Telephone Encounter (Signed)
Stopped Linzess because she is now having severe diarrhea   She still wants prior auth done on Linzess 72 and will contact her pharmacy to fax over request

## 2018-07-28 ENCOUNTER — Encounter: Payer: Self-pay | Admitting: Gastroenterology

## 2018-08-06 NOTE — Telephone Encounter (Signed)
Prior authorization done for Linzess 72 mcg  Waiting on response

## 2018-08-10 ENCOUNTER — Telehealth: Payer: Self-pay

## 2018-08-10 NOTE — Telephone Encounter (Signed)
Pharmacy informed of approval of the [place]. She ran it and it went thru for $35.00. I tried to call and let her know , it says her voice mail is full and cannot except messages.

## 2018-08-24 ENCOUNTER — Other Ambulatory Visit: Payer: Self-pay

## 2018-08-24 ENCOUNTER — Telehealth: Payer: Self-pay | Admitting: Gastroenterology

## 2018-08-24 DIAGNOSIS — R197 Diarrhea, unspecified: Secondary | ICD-10-CM

## 2018-08-24 MED ORDER — DEXLANSOPRAZOLE 30 MG PO CPDR: 30.0000 mg | DELAYED_RELEASE_CAPSULE | Freq: Every day | ORAL | 3 refills | Status: DC

## 2018-08-24 NOTE — Telephone Encounter (Signed)
Use advised patient to stay hydrated, sips of Pedialyte or electrolyte drink every 10 to 15 minutes.  If continues to have persistent diarrhea will check GI pathogen panel to exclude C. difficile or acute gastroenteritis.  Please advise her to discontinue apple cider vinegar, no scientific evidence to support.  Thanks

## 2018-08-24 NOTE — Telephone Encounter (Signed)
Patient instructed and agrees to this plan.

## 2018-08-24 NOTE — Telephone Encounter (Signed)
Calls with complaints of diarrhea since last Monday. % stools today. Urgently has to go to the bathroom. Eating food triggers a need to have a stool. No sick contacts. Afebrile. No blood with the diarrhea. She was taking apple cider vinegar tablets. Stopped those when these symptoms developed. No improvement. She is not on Linzess and never started it due to the cost.

## 2018-09-06 ENCOUNTER — Other Ambulatory Visit: Payer: BLUE CROSS/BLUE SHIELD

## 2018-09-06 DIAGNOSIS — R197 Diarrhea, unspecified: Secondary | ICD-10-CM

## 2018-09-07 LAB — GASTROINTESTINAL PATHOGEN PANEL PCR
C. difficile Tox A/B, PCR: NOT DETECTED
Campylobacter, PCR: NOT DETECTED
Cryptosporidium, PCR: NOT DETECTED
E coli (ETEC) LT/ST PCR: NOT DETECTED
E coli (STEC) stx1/stx2, PCR: NOT DETECTED
E coli 0157, PCR: NOT DETECTED
Giardia lamblia, PCR: NOT DETECTED
Norovirus, PCR: NOT DETECTED
Rotavirus A, PCR: NOT DETECTED
Salmonella, PCR: NOT DETECTED
Shigella, PCR: NOT DETECTED

## 2018-09-08 ENCOUNTER — Telehealth: Payer: Self-pay | Admitting: Gastroenterology

## 2018-09-08 NOTE — Telephone Encounter (Signed)
Patient called said that it has 3 weeks and she is still not feeling well with her bowel moments. She would like to speak to someone.

## 2018-09-08 NOTE — Telephone Encounter (Signed)
Can you please schedule a follow up with Dr Silverio Decamp?

## 2018-09-09 ENCOUNTER — Telehealth: Payer: Self-pay | Admitting: Gastroenterology

## 2018-09-09 ENCOUNTER — Other Ambulatory Visit: Payer: Self-pay

## 2018-09-09 ENCOUNTER — Other Ambulatory Visit (INDEPENDENT_AMBULATORY_CARE_PROVIDER_SITE_OTHER): Payer: BLUE CROSS/BLUE SHIELD

## 2018-09-09 DIAGNOSIS — R197 Diarrhea, unspecified: Secondary | ICD-10-CM

## 2018-09-09 LAB — HIGH SENSITIVITY CRP: CRP, High Sensitivity: 2.63 mg/L (ref 0.000–5.000)

## 2018-09-09 MED ORDER — DIPHENOXYLATE-ATROPINE 2.5-0.025 MG PO TABS: 1.0000 | ORAL_TABLET | Freq: Three times a day (TID) | ORAL | 0 refills | Status: AC | PRN

## 2018-09-09 NOTE — Telephone Encounter (Signed)
Please check CRP, inflammatory marker  Send Rx for Lomotil 1 tablet q8h as needed X 90 tabs with no refills. Thanks  Follow up virtual visit July 2nd

## 2018-09-09 NOTE — Telephone Encounter (Signed)
Spoke with the patient in detail. She agrees to this plan of care. Rx called in to [facility].

## 2018-09-09 NOTE — Telephone Encounter (Signed)
Scheduled for 09/22/18 with Tye Savoy, NP. That is the first opening on the schedule.  She says she cannot wait that long. She cannot work due to the diarrhea. She "withering away to nothing." She feels it is a IBD flare.

## 2018-09-09 NOTE — Telephone Encounter (Signed)
Pt requested a call back to discuss upcoming appt.

## 2018-09-10 ENCOUNTER — Telehealth: Payer: Self-pay | Admitting: Gastroenterology

## 2018-09-10 ENCOUNTER — Other Ambulatory Visit: Payer: Self-pay

## 2018-09-10 MED ORDER — BUDESONIDE 3 MG PO CPEP: 9.0000 mg | ORAL_CAPSULE | Freq: Every day | ORAL | 0 refills | Status: DC

## 2018-09-10 NOTE — Telephone Encounter (Signed)
Pt called to inquire about test results.

## 2018-09-16 ENCOUNTER — Encounter: Payer: Self-pay | Admitting: Gastroenterology

## 2018-09-16 ENCOUNTER — Ambulatory Visit (INDEPENDENT_AMBULATORY_CARE_PROVIDER_SITE_OTHER): Payer: BLUE CROSS/BLUE SHIELD | Admitting: Gastroenterology

## 2018-09-16 ENCOUNTER — Telehealth: Payer: Self-pay | Admitting: *Deleted

## 2018-09-16 VITALS — BP 100/60 | HR 87 | Temp 98.6°F | Ht 61.0 in | Wt 142.6 lb

## 2018-09-16 DIAGNOSIS — R197 Diarrhea, unspecified: Secondary | ICD-10-CM | POA: Diagnosis not present

## 2018-09-16 DIAGNOSIS — K50818 Crohn's disease of both small and large intestine with other complication: Secondary | ICD-10-CM | POA: Diagnosis not present

## 2018-09-16 NOTE — Progress Notes (Signed)
Jessica Cordova    [id number]    November 20, 1980  Primary Care Physician:Lee, Joylene Igo, MD  Referring Physician: Cher Nakai, MD [address] [place],  [place] [address]   Chief complaint: Diarrhea  HPI:  38 year old female with history of Crohn's disease initially diagnosed in 1992 with complaints of worsening diarrhea in the past 4 to 6 weeks.  She was having increased bowel frequency with 5-7 bowel movements, mostly semi-formed to liquid.  Denies any blood in stool.  Has lower abdominal discomfort and cramping, improves after a bowel movement. Symptoms somewhat better since starting Entocort and Lomotil, currently having 2-3 bowel movements a day but still not back to baseline.  Feels her symptoms started after she increased Apriso dose. GI pathogen panel negative.  CRP 2.6  Pertinent GI history: She was initially managed on chronic prednisone,subsequently was on Remicade around early 2000'sand then she was switched to Humira for unclear reason.Patient does not recall having any allergic reaction or issue with Remicade.She was in clinical remission on Humira 40 mg every 2 weeks but she stopped taking it in 2016 due to increased out-of-pocket expenses with change in insurance.ColonoscopyMay 2018was negative for active colitis and CT enterographyMay 2018did not show any evidence of active diseasein thesmall bowel other than mild thickening of the terminal ileum.    Outpatient Encounter Medications as of 09/16/2018  Medication Sig  . budesonide (ENTOCORT EC) 3 MG 24 hr capsule Take 3 capsules (9 mg total) by mouth daily.  Marland Kitchen Dexlansoprazole (DEXILANT) 30 MG capsule Take 1 capsule (30 mg total) by mouth daily.  Marland Kitchen dicyclomine (BENTYL) 10 MG capsule Take 1 capsule (10 mg total) by mouth 4 (four) times daily -  before meals and at bedtime.  . diphenoxylate-atropine (LOMOTIL) 2.5-0.025 MG tablet Take 1 tablet by mouth every 8 (eight) hours as needed for diarrhea  or loose stools.  . fluticasone (FLONASE) 50 MCG/ACT nasal spray Place 1 spray into both nostrils as needed for allergies or rhinitis.  Marland Kitchen linaclotide (LINZESS) 72 MCG capsule Take 1 capsule (72 mcg total) by mouth daily before breakfast.  . mesalamine (APRISO) 0.375 g 24 hr capsule Take 2 capsules (0.75 g total) by mouth daily.  . promethazine (PHENERGAN) 25 MG tablet Take 0.5 tablets (12.5 mg total) by mouth every 6 (six) hours as needed for nausea or vomiting.   Facility-Administered Encounter Medications as of 09/16/2018  Medication  . 0.9 %  sodium chloride infusion  . acetaminophen (TYLENOL) tablet 650 mg  . diphenhydrAMINE (BENADRYL) capsule 50 mg  . inFLIXimab (REMICADE) 5 mg/kg = 300 mg in sodium chloride 0.9 % 250 mL infusion    Allergies as of 09/16/2018  . (No Known Allergies)    Past Medical History:  Diagnosis Date  . Anxiety   . Crohn's disease ([place])   . IBS (irritable bowel syndrome)   . Migraines   . Nephrolithiasis   . Neutrophilic leukocytosis   . Pseudotumor cerebri   . Renal colic   . Vitamin D deficiency, unspecified     Past Surgical History:  Procedure Laterality Date  . FOOT SURGERY     left x 2  . LEEP    . TONSILLECTOMY    . WISDOM TOOTH EXTRACTION      Family History  Problem Relation Age of Onset  . Colon cancer Paternal Aunt   . Heart disease Maternal Grandfather   . Heart disease Paternal Grandfather   .  Hypertension Father     Social History   Socioeconomic History  . Marital status: Single    Spouse name: Not on file  . Number of children: 1  . Years of education: Not on file  . Highest education level: Not on file  Occupational History    Employer: [employer]  Social Needs  . Financial resource strain: Not on file  . Food insecurity    Worry: Not on file    Inability: Not on file  . Transportation needs    Medical: Not on file    Non-medical: Not on file  Tobacco Use  . Smoking status: Former Smoker    Quit  date: 03/18/2007    Years since quitting: 11.5  . Smokeless tobacco: Never Used  Substance and Sexual Activity  . Alcohol use: No    Comment: occasionally  . Drug use: No  . Sexual activity: Not on file  Lifestyle  . Physical activity    Days per week: Not on file    Minutes per session: Not on file  . Stress: Not on file  Relationships  . Social Herbalist on phone: Not on file    Gets together: Not on file    Attends religious service: Not on file    Active member of club or organization: Not on file    Attends meetings of clubs or organizations: Not on file    Relationship status: Not on file  . Intimate partner violence    Fear of current or ex partner: Not on file    Emotionally abused: Not on file    Physically abused: Not on file    Forced sexual activity: Not on file  Other Topics Concern  . Not on file  Social History Narrative  . Not on file      Review of systems: Review of Systems  Constitutional: Negative for fever and chills.  HENT: Negative.   Eyes: Negative for blurred vision.  Respiratory: Negative for cough, shortness of breath and wheezing.   Cardiovascular: Negative for chest pain and palpitations.  Gastrointestinal: as per HPI Genitourinary: Negative for dysuria, urgency, frequency and hematuria.  Musculoskeletal: Negative for myalgias, back pain and joint pain.  Skin: Negative for itching and rash.  Neurological: Negative for dizziness, tremors, focal weakness, seizures and loss of consciousness.  Endo/Heme/Allergies: Positive for seasonal allergies.  Psychiatric/Behavioral: Negative for depression, suicidal ideas and hallucinations.  All other systems reviewed and are negative.   Physical Exam: Vitals:   09/16/18 1145  BP: 100/60  Pulse: 87  Temp: 98.6 F (37 C)   Body mass index is 26.94 kg/m. Gen:      No acute distress HEENT:  EOMI, sclera anicteric Neck:     No masses; no thyromegaly Lungs:    Clear to auscultation  bilaterally; normal respiratory effort CV:         Regular rate and rhythm; no murmurs Abd:      + bowel sounds; soft, non-tender; no palpable masses, no distension Ext:    No edema; adequate peripheral perfusion Skin:      Warm and dry; no rash Neuro: alert and oriented x 3 Psych: normal mood and affect  Data Reviewed:  Reviewed labs, radiology imaging, old records and pertinent past GI work up   Assessment and Plan/Recommendations:  38 year old female with history of Crohn's disease, was in clinical remission until recent episode of diarrhea GI pathogen panel negative, mild elevation of CRP Presentation concerning  for 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 Greater than 50% of the time used for counseling as well as treatment plan and follow-up. She had multiple questions which were answered to her satisfaction  K. Denzil Magnuson , MD    CC: Cher Nakai, MD

## 2018-09-16 NOTE — Telephone Encounter (Signed)
Covid-19 screening questions   Do you now or have you had a fever in the last 14 days? No  Do you have any respiratory symptoms of shortness of breath or cough now or in the last 14 days? no  Do you have any family members or close contacts with diagnosed or suspected Covid-19 in the past 14 days? no  Have you been tested for Covid-19 and found to be positive? no

## 2018-09-16 NOTE — Patient Instructions (Addendum)
Stop Apriso  Continue Endocort 9 mg daily for 2 weeks then decrease to 6 mg for 1 week then 3 mg for 1 week  Call with an update next week on how you are doing  Continue Lomotil   Follow up virtual visit in 3-4 weeks   I appreciate the  opportunity to care for you  Thank You   Harl Bowie , MD

## 2018-09-21 ENCOUNTER — Encounter: Payer: Self-pay | Admitting: Gastroenterology

## 2018-09-22 ENCOUNTER — Ambulatory Visit: Payer: BLUE CROSS/BLUE SHIELD | Admitting: Nurse Practitioner

## 2018-09-23 ENCOUNTER — Telehealth: Payer: Self-pay | Admitting: Gastroenterology

## 2018-09-23 NOTE — Telephone Encounter (Signed)
Checking in with her progress. She is having less frequent diarrhea now. She has added some food back to her diet. She does have the spells of bowel movements that end in diarrhea that begin urgently. She is experiencing cramping with no bowel movement. She is on budesonide 9 mg. She takes Bentyl usually at bedtime. She is not sleeping through the night still. Up at least once for a bowel movement.

## 2018-09-23 NOTE — Telephone Encounter (Signed)
Patient called to give an update on how she is doing per her visit with Dr. Silverio Decamp.

## 2018-09-24 NOTE — Telephone Encounter (Signed)
No answer. Left information on her voicemail.

## 2018-09-24 NOTE — Telephone Encounter (Signed)
Please advise her to continue the 18m daily Budesonide for additional 2 weeks and not to start tapering it down. Take Lomotil before going to bed. Follow up as scheduled. Thanks

## 2018-10-03 ENCOUNTER — Other Ambulatory Visit: Payer: Self-pay | Admitting: Gastroenterology

## 2018-10-08 NOTE — Telephone Encounter (Signed)
Patient reports she is improving. "It happens once a day to every other day. If I get sick it is always in the middle of the night between 3 and 4 am." Stools are formed as they start and end as diarrhea. She does feel she has made more improvement even as she admits she is not 100% yet. She has continued her medications as she was instructed. Please advise.

## 2018-10-08 NOTE — Telephone Encounter (Signed)
Patient wants to give an update for how she is doing with her change in medication. She said she is doing better but would like to speak to someone.

## 2018-10-11 ENCOUNTER — Other Ambulatory Visit: Payer: Self-pay

## 2018-10-11 MED ORDER — COLESTIPOL HCL 1 G PO TABS: 1.0000 g | ORAL_TABLET | Freq: Every day | ORAL | 2 refills | Status: AC

## 2018-10-11 MED ORDER — BUDESONIDE 3 MG PO CPEP: 9.0000 mg | ORAL_CAPSULE | Freq: Every day | ORAL | 2 refills | Status: DC

## 2018-10-11 NOTE — Telephone Encounter (Signed)
Discussed with the patient. She agrees to this plan of care. She will keep her appointment as scheduled.

## 2018-10-11 NOTE — Telephone Encounter (Signed)
Continue budesonide 9 mg daily for additional 2 weeks followed by taper every 2 weeks decrease by 3 mg. Start Colestid 1 g daily at bedtime Follow-up office visit next available.

## 2018-10-27 ENCOUNTER — Ambulatory Visit (INDEPENDENT_AMBULATORY_CARE_PROVIDER_SITE_OTHER): Payer: BLUE CROSS/BLUE SHIELD | Admitting: Gastroenterology

## 2018-10-27 ENCOUNTER — Encounter: Payer: Self-pay | Admitting: Gastroenterology

## 2018-10-27 VITALS — Ht 61.0 in | Wt 142.0 lb

## 2018-10-27 DIAGNOSIS — R197 Diarrhea, unspecified: Secondary | ICD-10-CM

## 2018-10-27 DIAGNOSIS — R1084 Generalized abdominal pain: Secondary | ICD-10-CM | POA: Diagnosis not present

## 2018-10-27 DIAGNOSIS — K582 Mixed irritable bowel syndrome: Secondary | ICD-10-CM

## 2018-10-27 DIAGNOSIS — K50819 Crohn's disease of both small and large intestine with unspecified complications: Secondary | ICD-10-CM

## 2018-10-27 MED ORDER — BUDESONIDE 3 MG PO CPEP: 9.0000 mg | ORAL_CAPSULE | Freq: Every day | ORAL | 3 refills | Status: AC

## 2018-10-27 MED ORDER — DICYCLOMINE HCL 10 MG PO CAPS: 10.0000 mg | ORAL_CAPSULE | Freq: Four times a day (QID) | ORAL | 3 refills | Status: AC | PRN

## 2018-10-27 NOTE — Patient Instructions (Addendum)
You have been scheduled for a CT scan of the abdomen and pelvis at [facility] ([address]---this is in the same building as [facility]).   You are scheduled on 11/17/2018 at 2:00 pm. You should arrive at 12:45 pm to your appointment time for registration. Please follow the written instructions below on the day of your exam:  WARNING: IF YOU ARE ALLERGIC TO IODINE/X-RAY DYE, PLEASE NOTIFY RADIOLOGY IMMEDIATELY AT [phone]! YOU WILL BE GIVEN A 13 HOUR PREMEDICATION PREP.  1) Do not eat or drink anything after 10:00 am (4 hours prior to your test) You may take any medications as prescribed with a small amount of water, if necessary. If you take any of the following medications: METFORMIN, GLUCOPHAGE, GLUCOVANCE, AVANDAMET, RIOMET, FORTAMET, [place] MET, JANUMET, GLUMETZA or METAGLIP, you MAY be asked to HOLD this medication 48 hours AFTER the exam.  The purpose of you drinking the oral contrast is to aid in the visualization of your intestinal tract. The contrast solution may cause some diarrhea. Depending on your individual set of symptoms, you may also receive an intravenous injection of x-ray contrast/dye. Plan on being at [facility] for 30 minutes or longer, depending on the type of exam you are having performed.  This test typically takes 30-45 minutes to complete.  If you have any questions regarding your exam or if you need to reschedule, you may call the CT department at [phone] between the hours of 8:00 am and 5:00 pm, Monday-Friday.  ________________________________________________________________________   We have sent the following medications to your pharmacy for you to pick up at your convenience:  refills for budesonide 9 mg daily for additional 3 months  Increase dicyclomine to 20 mg every 6 hours as needed  Scheduled your CT enterography with contrast for 11/17/2018 @2 :00pm arrive at 12:45pm  Follow-up office visit in 4 to 6  weeks  Thank you for choosing me and [facility].  Damaris Hippo, MD

## 2018-11-05 NOTE — Progress Notes (Signed)
Jessica Cordova    [id number]    [birth year]  Primary Care Physician:Lee, Joylene Igo, MD  Referring Physician: Cher Nakai, MD [place],  [place] [address]  This service was provided via  telemedicine due to Organ 19 pandemic.  I connected with@ on 11/05/18 at 10:40 AM EDT by a video enabled telemedicine application and verified that I am speaking with the correct person using two identifiers.  Patient location: Home Provider location: Office   I discussed the limitations, risks, security and privacy concerns of performing an evaluation and management service by video enabled telemedicine application and the availability of in person appointments. I also discussed with the patient that there may be a patient responsible charge related to this service. The patient expressed understanding and agreed to proceed.   The persons participating in this telemedicine service were myself and the patient    Chief complaint: Crohn's disease  HPI:  39 year old with Crohn's disease, initial diagnosis of 1992  Diarrhea improved since stopping oral mesalamine/Apriso and on budesonide  She is having intermittent constipation especially when she takes Lomotil for abdominal cramping  Denies any rectal bleeding or melena  Pertinent GI history: She was initially managed on chronic prednisone,subsequently was on Remicade around early 2000'sand then she was switched to Humira for unclear reason.Patient does not recall having any allergic reaction or issue with Remicade.She was in clinical remission on Humira 40 mg every 2 weeks but she stopped taking it in 2016 due to increased out-of-pocket expenses with change in insurance.ColonoscopyMay 2018was negative for active colitis and CT enterographyMay 2018did not show any evidence of active diseasein thesmall bowel other than mild thickening of the terminal ileum.   Outpatient Encounter Medications as of  10/27/2018  Medication Sig  . budesonide (ENTOCORT EC) 3 MG 24 hr capsule Take 3 capsules (9 mg total) by mouth daily.  . colestipol (COLESTID) 1 g tablet Take 1 tablet (1 g total) by mouth at bedtime.  Marland Kitchen Dexlansoprazole (DEXILANT) 30 MG capsule Take 1 capsule (30 mg total) by mouth daily.  Marland Kitchen dicyclomine (BENTYL) 10 MG capsule Take 1 capsule (10 mg total) by mouth every 6 (six) hours as needed for spasms.  . diphenoxylate-atropine (LOMOTIL) 2.5-0.025 MG tablet Take 1 tablet by mouth every 8 (eight) hours as needed for diarrhea or loose stools.  . fluticasone (FLONASE) 50 MCG/ACT nasal spray Place 1 spray into both nostrils as needed for allergies or rhinitis.  Marland Kitchen mesalamine (APRISO) 0.375 g 24 hr capsule Take 2 capsules (0.75 g total) by mouth daily.  . promethazine (PHENERGAN) 25 MG tablet Take 0.5 tablets (12.5 mg total) by mouth every 6 (six) hours as needed for nausea or vomiting.  . [DISCONTINUED] budesonide (ENTOCORT EC) 3 MG 24 hr capsule Take 3 capsules (9 mg total) by mouth daily.  . [DISCONTINUED] dicyclomine (BENTYL) 10 MG capsule Take 1 capsule (10 mg total) by mouth 4 (four) times daily -  before meals and at bedtime.   Facility-Administered Encounter Medications as of 10/27/2018  Medication  . 0.9 %  sodium chloride infusion  . acetaminophen (TYLENOL) tablet 650 mg  . diphenhydrAMINE (BENADRYL) capsule 50 mg  . inFLIXimab (REMICADE) 5 mg/kg = 300 mg in sodium chloride 0.9 % 250 mL infusion    Allergies as of 10/27/2018  . (No Known Allergies)    Past Medical History:  Diagnosis Date  . Anxiety   . Crohn's disease ([place])   .  IBS (irritable bowel syndrome)   . Migraines   . Nephrolithiasis   . Neutrophilic leukocytosis   . Pseudotumor cerebri   . Renal colic   . Vitamin D deficiency, unspecified     Past Surgical History:  Procedure Laterality Date  . FOOT SURGERY     left x 2  . LEEP    . TONSILLECTOMY    . WISDOM TOOTH EXTRACTION      Family History  Problem  Relation Age of Onset  . Colon cancer Paternal Aunt   . Heart disease Maternal Grandfather   . Heart disease Paternal Grandfather   . Hypertension Father     Social History   Socioeconomic History  . Marital status: Single    Spouse name: Not on file  . Number of children: 1  . Years of education: Not on file  . Highest education level: Not on file  Occupational History    Employer: [employer]  Social Needs  . Financial resource strain: Not on file  . Food insecurity    Worry: Not on file    Inability: Not on file  . Transportation needs    Medical: Not on file    Non-medical: Not on file  Tobacco Use  . Smoking status: Former Smoker    Quit date: 03/18/2007    Years since quitting: 11.6  . Smokeless tobacco: Never Used  Substance and Sexual Activity  . Alcohol use: No    Comment: occasionally  . Drug use: No  . Sexual activity: Not on file  Lifestyle  . Physical activity    Days per week: Not on file    Minutes per session: Not on file  . Stress: Not on file  Relationships  . Social Herbalist on phone: Not on file    Gets together: Not on file    Attends religious service: Not on file    Active member of club or organization: Not on file    Attends meetings of clubs or organizations: Not on file    Relationship status: Not on file  . Intimate partner violence    Fear of current or ex partner: Not on file    Emotionally abused: Not on file    Physically abused: Not on file    Forced sexual activity: Not on file  Other Topics Concern  . Not on file  Social History Narrative  . Not on file      Review of systems: Review of Systems as per HPI All other systems reviewed and are negative.   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 The patient was provided an opportunity to ask questions and all were answered. The patient agreed with the plan and demonstrated an understanding of the instructions.   The  patient was advised to call back or seek an in-person evaluation if the symptoms worsen or if the condition fails to improve as anticipated.     Harl Bowie, MD   CC: Cher Nakai, MD

## 2018-11-09 ENCOUNTER — Telehealth: Payer: Self-pay | Admitting: Gastroenterology

## 2018-11-09 NOTE — Telephone Encounter (Signed)
She has found a female provider through her insurance. She asks if you would review her profile and advise the patient if you think she would be a good choice for her? [email or web address] Dr Hendricks Limes, MD Thank you

## 2018-11-09 NOTE — Telephone Encounter (Signed)
Pt requested a call back to discuss scheduling CT scan.

## 2018-11-09 NOTE — Telephone Encounter (Signed)
Unfortunate that [facility] is out of network for her.  We can help with faxing over her records to the new provider that she is planning to establish with.  I do not know Dr. Hendricks Limes personally but seems well-qualified.  Thank you

## 2018-11-09 NOTE — Telephone Encounter (Signed)
[facility] is out of network with her insurance now. She is upset about this.considering paying out of pocket.  She wants help with referral to new GI. Patient has someone in mind. She will get me the information to share with Dr Silverio Decamp.

## 2018-11-10 ENCOUNTER — Other Ambulatory Visit: Payer: Self-pay

## 2018-11-10 DIAGNOSIS — K50819 Crohn's disease of both small and large intestine with unspecified complications: Secondary | ICD-10-CM

## 2018-11-10 NOTE — Telephone Encounter (Signed)
Patient returned phone call stating that she does want to proceed with referral.

## 2018-11-10 NOTE — Telephone Encounter (Signed)
I have contacted the office of Dr Hendricks Limes. [facility] She is accepting new patients. Confirmed with member of the staff, Chong Sicilian, to fax the records and her demographics. They will reach out to the patient to schedule her an appointment.  Office [phone]

## 2018-11-10 NOTE — Telephone Encounter (Signed)
Called back to the patient. No answer. Left the information on her voicemail. Requested she call back to let me know if she wants to go forward with the referral.

## 2018-11-15 NOTE — Telephone Encounter (Signed)
Appointment schedulded on 12/07/2018 at 3pm with Dr Exie Parody

## 2018-11-17 ENCOUNTER — Inpatient Hospital Stay: Admission: RE | Admit: 2018-11-17 | Payer: BLUE CROSS/BLUE SHIELD | Source: Ambulatory Visit

## 2018-12-01 ENCOUNTER — Ambulatory Visit: Payer: BLUE CROSS/BLUE SHIELD | Admitting: Gastroenterology

## 2018-12-07 DIAGNOSIS — K50819 Crohn's disease of both small and large intestine with unspecified complications: Secondary | ICD-10-CM | POA: Diagnosis not present

## 2019-01-11 DIAGNOSIS — N838 Other noninflammatory disorders of ovary, fallopian tube and broad ligament: Secondary | ICD-10-CM | POA: Diagnosis not present

## 2019-01-24 DIAGNOSIS — N760 Acute vaginitis: Secondary | ICD-10-CM | POA: Diagnosis not present

## 2019-01-24 DIAGNOSIS — N83201 Unspecified ovarian cyst, right side: Secondary | ICD-10-CM | POA: Diagnosis not present

## 2019-01-24 DIAGNOSIS — D729 Disorder of white blood cells, unspecified: Secondary | ICD-10-CM | POA: Diagnosis not present

## 2019-01-24 DIAGNOSIS — B9689 Other specified bacterial agents as the cause of diseases classified elsewhere: Secondary | ICD-10-CM | POA: Diagnosis not present

## 2019-01-26 DIAGNOSIS — J3089 Other allergic rhinitis: Secondary | ICD-10-CM | POA: Diagnosis not present

## 2019-01-26 DIAGNOSIS — K5190 Ulcerative colitis, unspecified, without complications: Secondary | ICD-10-CM | POA: Diagnosis not present

## 2019-01-26 DIAGNOSIS — R6889 Other general symptoms and signs: Secondary | ICD-10-CM | POA: Diagnosis not present

## 2019-01-26 DIAGNOSIS — Z20828 Contact with and (suspected) exposure to other viral communicable diseases: Secondary | ICD-10-CM | POA: Diagnosis not present

## 2019-02-01 DIAGNOSIS — F17200 Nicotine dependence, unspecified, uncomplicated: Secondary | ICD-10-CM | POA: Diagnosis not present

## 2019-02-01 DIAGNOSIS — J3089 Other allergic rhinitis: Secondary | ICD-10-CM | POA: Diagnosis not present

## 2019-02-01 DIAGNOSIS — L509 Urticaria, unspecified: Secondary | ICD-10-CM | POA: Diagnosis not present

## 2019-02-01 DIAGNOSIS — K5190 Ulcerative colitis, unspecified, without complications: Secondary | ICD-10-CM | POA: Diagnosis not present

## 2019-02-02 DIAGNOSIS — K5190 Ulcerative colitis, unspecified, without complications: Secondary | ICD-10-CM | POA: Diagnosis not present

## 2019-02-02 DIAGNOSIS — J3089 Other allergic rhinitis: Secondary | ICD-10-CM | POA: Diagnosis not present

## 2019-02-02 DIAGNOSIS — F17200 Nicotine dependence, unspecified, uncomplicated: Secondary | ICD-10-CM | POA: Diagnosis not present

## 2019-02-02 DIAGNOSIS — L509 Urticaria, unspecified: Secondary | ICD-10-CM | POA: Diagnosis not present

## 2019-03-03 DIAGNOSIS — F17200 Nicotine dependence, unspecified, uncomplicated: Secondary | ICD-10-CM | POA: Diagnosis not present

## 2019-03-03 DIAGNOSIS — K5190 Ulcerative colitis, unspecified, without complications: Secondary | ICD-10-CM | POA: Diagnosis not present

## 2019-03-03 DIAGNOSIS — J3089 Other allergic rhinitis: Secondary | ICD-10-CM | POA: Diagnosis not present

## 2019-03-03 DIAGNOSIS — Z20828 Contact with and (suspected) exposure to other viral communicable diseases: Secondary | ICD-10-CM | POA: Diagnosis not present

## 2019-09-12 ENCOUNTER — Other Ambulatory Visit: Payer: Self-pay | Admitting: Gastroenterology
